# Patient Record
Sex: Male | Born: 1985
Health system: Southern US, Community
[De-identification: ages and names within clinical notes are randomized; demographics above are authoritative.]

## PROBLEM LIST (undated history)

## (undated) DIAGNOSIS — R479 Unspecified speech disturbances: Secondary | ICD-10-CM

## (undated) DIAGNOSIS — F819 Developmental disorder of scholastic skills, unspecified: Secondary | ICD-10-CM

## (undated) HISTORY — DX: Developmental disorder of scholastic skills, unspecified: F81.9

## (undated) HISTORY — DX: Unspecified speech disturbances: R47.9

## (undated) HISTORY — PX: NO PAST SURGERIES: SHX2092

---

## 2005-11-19 ENCOUNTER — Emergency Department (HOSPITAL_COMMUNITY): Admission: EM | Admit: 2005-11-19 | Discharge: 2005-11-19 | Payer: Self-pay | Admitting: Emergency Medicine

## 2008-08-04 ENCOUNTER — Emergency Department (HOSPITAL_COMMUNITY): Admission: EM | Admit: 2008-08-04 | Discharge: 2008-08-04 | Payer: Self-pay | Admitting: Emergency Medicine

## 2009-08-23 ENCOUNTER — Inpatient Hospital Stay (HOSPITAL_COMMUNITY): Admission: AD | Admit: 2009-08-23 | Discharge: 2009-08-23 | Payer: Self-pay | Admitting: Obstetrics & Gynecology

## 2009-10-08 IMAGING — CR DG WRIST COMPLETE 3+V*L*
4 series · 4 of 4 positions shown · non-contrast
Comparison: None

CLINICAL DATA: Left wrist pain status post fall

LEFT WRIST - COMPLETE 3+ VIEW

[x wrist pa left]
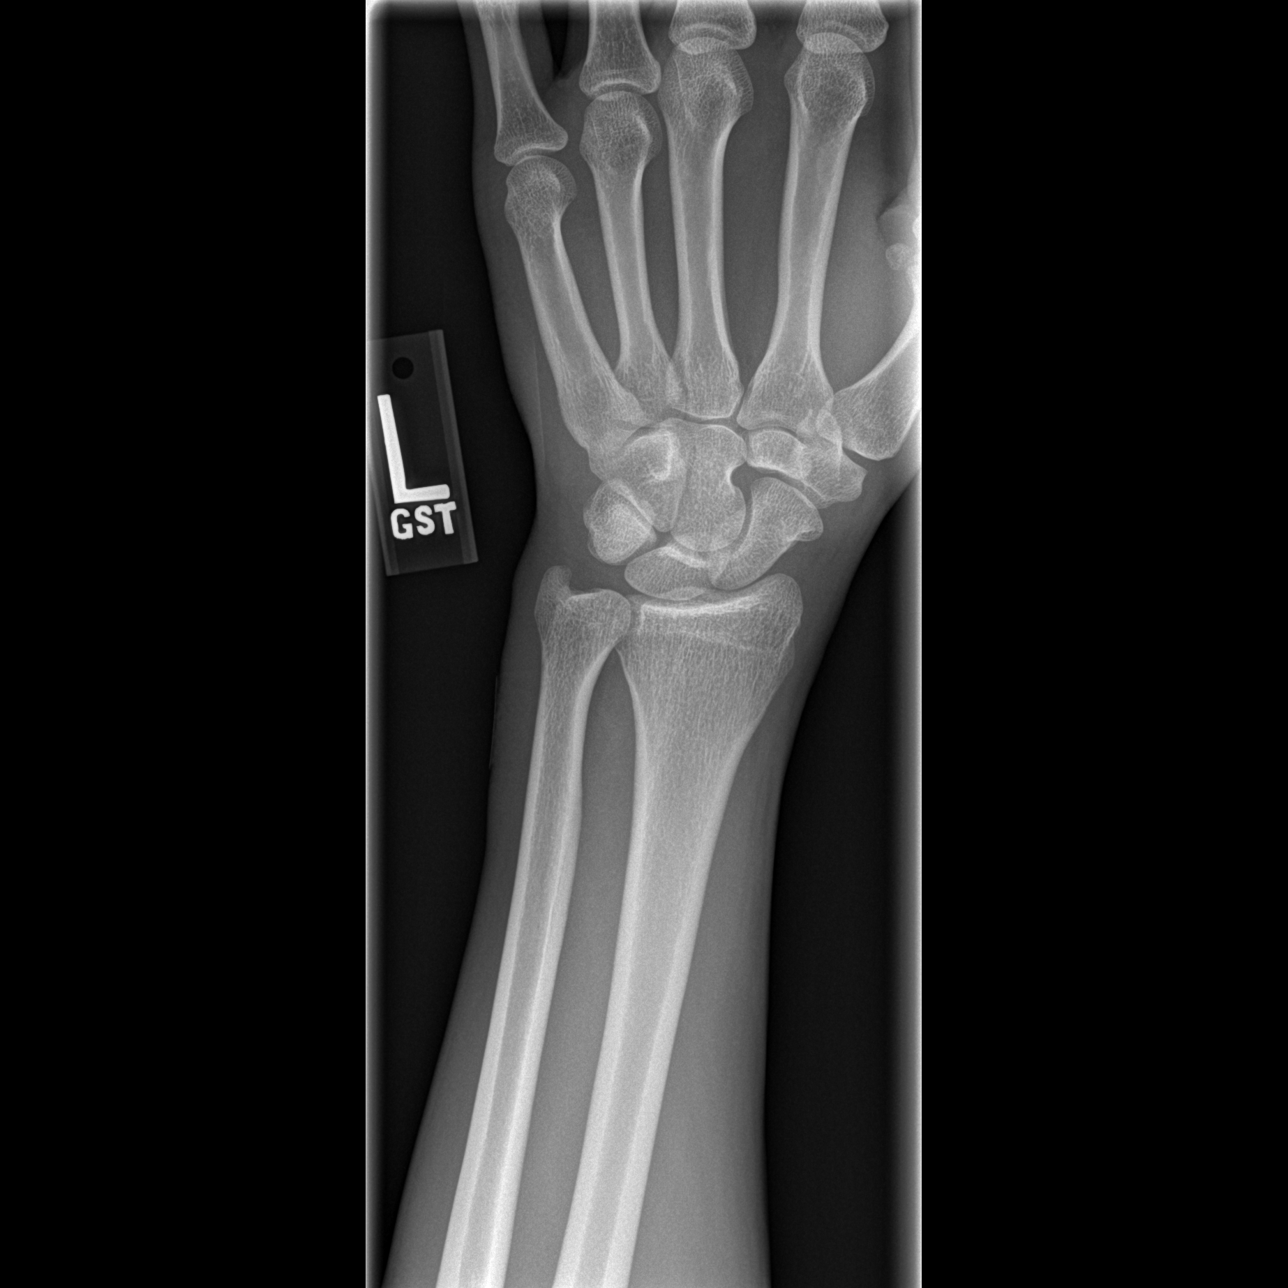

[x wrist obl left]
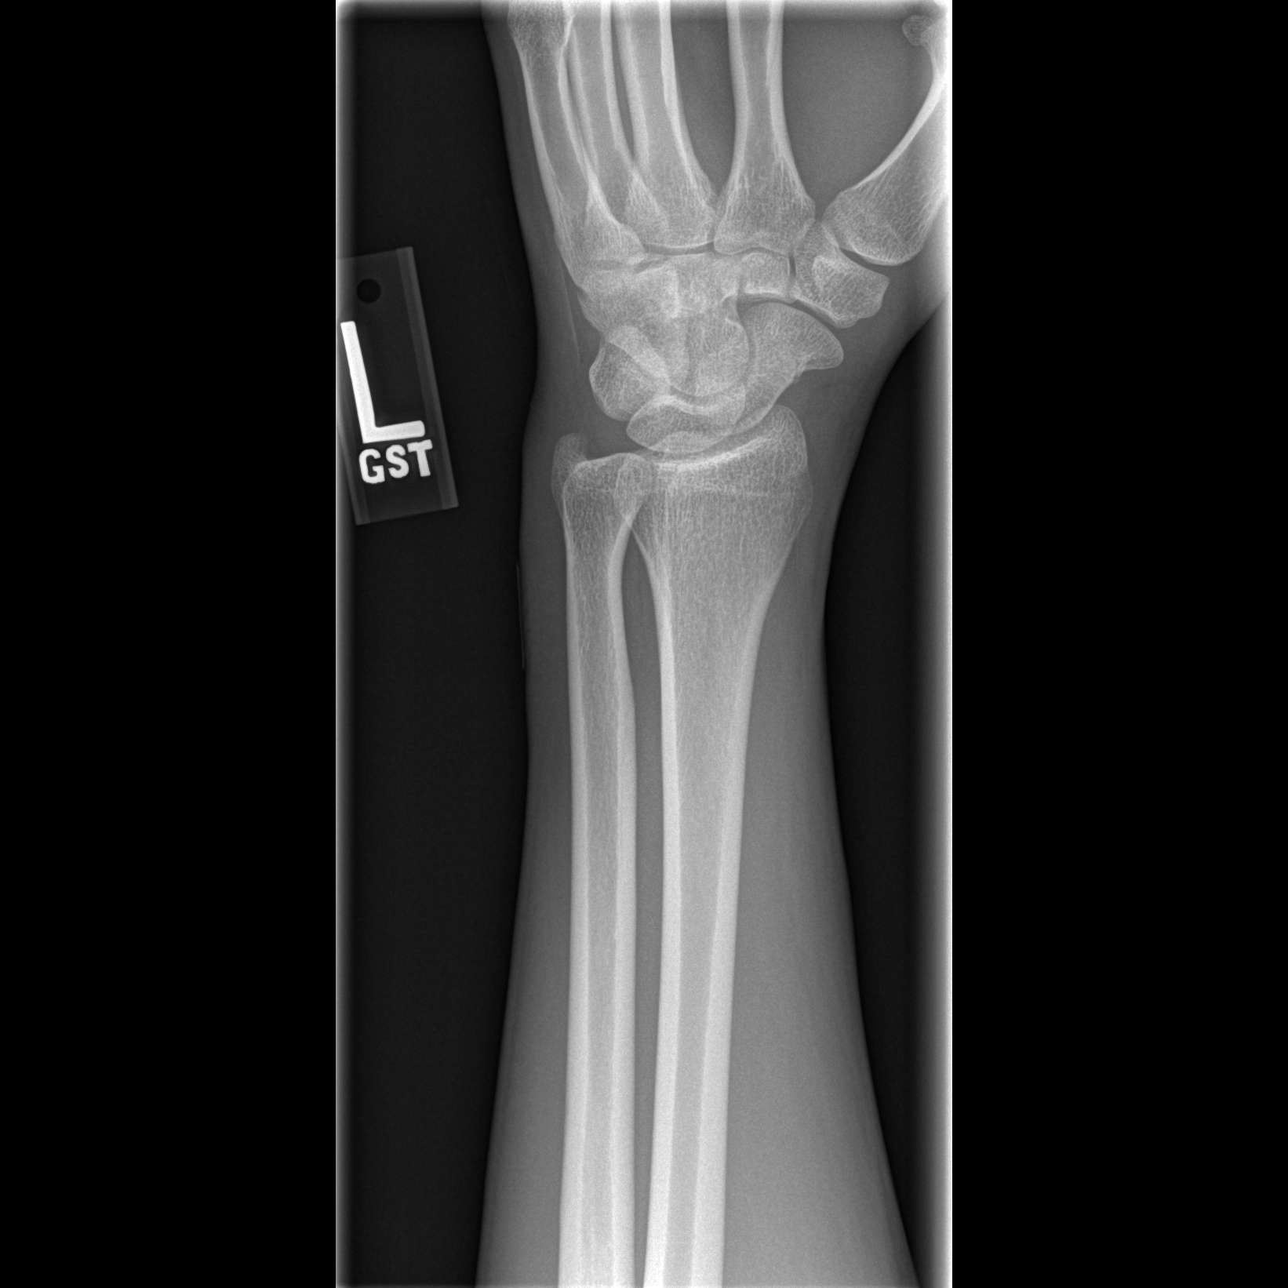

[x wrist lat left]
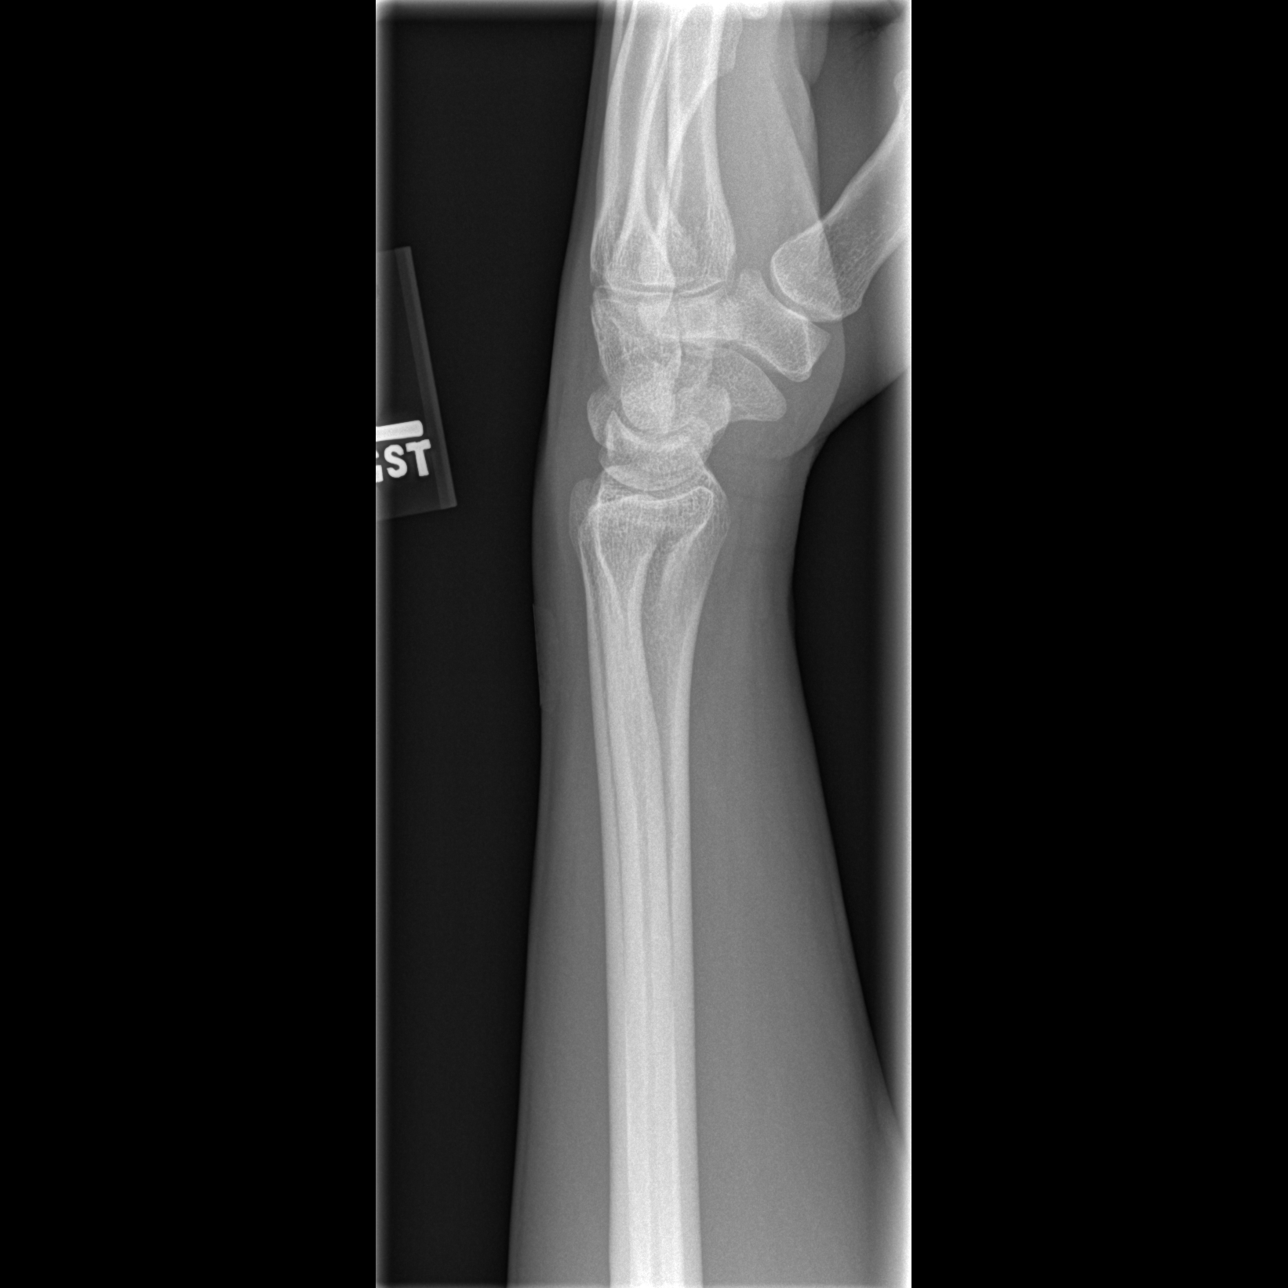

[x navicular]
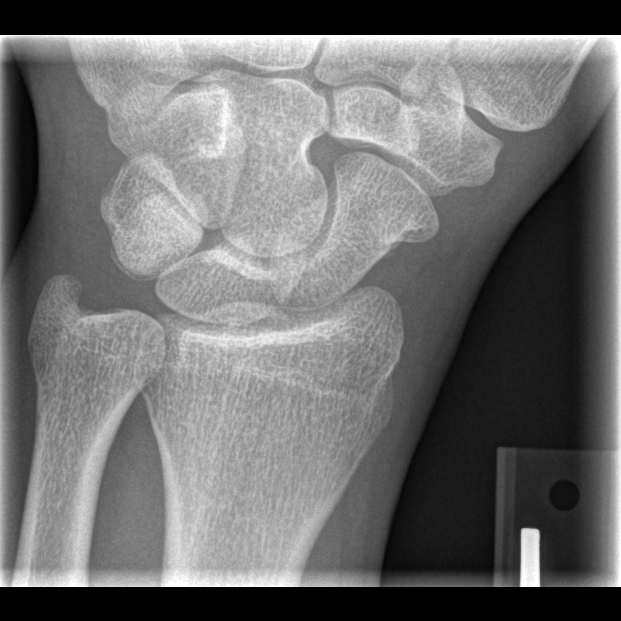

[4 of 4 positions shown; findings below may reference images not displayed]

FINDINGS: Tiny apparent bone fragment identified along radial margin of mid
scaphoid, suspicious for fracture.
This most likely extends towards the scaphoid waist.
Joint spaces preserved.
No additional fracture or dislocation identified.
IMPRESSION: Suspicious for nondisplaced fracture at scaphoid waist.

## 2010-10-16 ENCOUNTER — Ambulatory Visit: Payer: Self-pay | Admitting: Internal Medicine

## 2010-10-16 DIAGNOSIS — R5381 Other malaise: Secondary | ICD-10-CM

## 2010-10-16 DIAGNOSIS — R5383 Other fatigue: Secondary | ICD-10-CM

## 2010-10-16 DIAGNOSIS — J45909 Unspecified asthma, uncomplicated: Secondary | ICD-10-CM

## 2010-10-16 LAB — CONVERTED CEMR LAB
ALT: 9 units/L (ref 0–53)
AST: 15 units/L (ref 0–37)
BUN: 10 mg/dL (ref 6–23)
Basophils Absolute: 0 10*3/uL (ref 0.0–0.1)
Bilirubin, Direct: 0.2 mg/dL (ref 0.0–0.3)
Cholesterol: 162 mg/dL (ref 0–200)
Creatinine, Ser: 1.2 mg/dL (ref 0.4–1.5)
Eosinophils Relative: 1.5 % (ref 0.0–5.0)
GFR calc non Af Amer: 92.93 mL/min (ref >60.00)
HDL: 37.1 mg/dL — ABNORMAL LOW (ref >39.00)
LDL Cholesterol: 118 mg/dL — ABNORMAL HIGH (ref 0–99)
Monocytes Absolute: 0.5 10*3/uL (ref 0.1–1.0)
Monocytes Relative: 11.7 % (ref 3.0–12.0)
Neutrophils Relative %: 59 % (ref 43.0–77.0)
Platelets: 222 10*3/uL (ref 150.0–400.0)
Total Bilirubin: 1.4 mg/dL — ABNORMAL HIGH (ref 0.3–1.2)
Triglycerides: 37 mg/dL (ref 0.0–149.0)
VLDL: 7.4 mg/dL (ref 0.0–40.0)
WBC: 4.2 10*3/uL — ABNORMAL LOW (ref 4.5–10.5)

## 2010-11-08 ENCOUNTER — Telehealth: Payer: Self-pay | Admitting: Internal Medicine

## 2010-11-09 ENCOUNTER — Telehealth: Payer: Self-pay | Admitting: Internal Medicine

## 2010-12-06 ENCOUNTER — Telehealth (INDEPENDENT_AMBULATORY_CARE_PROVIDER_SITE_OTHER): Payer: Self-pay | Admitting: *Deleted

## 2010-12-12 NOTE — Assessment & Plan Note (Signed)
Summary: new / medicaid / # cd   Vital Signs:  Patient profile:   25 year old male Height:      73 inches (185.42 cm) Weight:      161.8 pounds (73.55 kg) BMI:     21.42 O2 Sat:      98 % on Room air Temp:     98.2 degrees F (36.78 degrees C) oral Pulse rate:   94 / minute BP sitting:   120 / 78  (left arm) Cuff size:   regular  Vitals Entered By: Orlan Leavens RMA (October 16, 2010 2:17 PM)  O2 Flow:  Room air CC: New patient Is Patient Diabetic? No Pain Assessment Patient in pain? no        Primary Care Jeanette Moffatt:  Newt Lukes MD  CC:  New patient.  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)  c/o fatigue onset >6 months ago, gradual course of decline denies depression symptoms  denies fever or weight loss no meds - rx or illegal concerned about FH DM and has never been checked  Preventive Screening-Counseling & Management  Alcohol-Tobacco     Alcohol drinks/day: 0     Alcohol Counseling: not indicated; patient does not drink     Smoking Status: never     Tobacco Counseling: not indicated; no tobacco use  Caffeine-Diet-Exercise     Does Patient Exercise: yes     Exercise Counseling: not indicated; exercise is adequate     Depression Counseling: not indicated; screening negative for depression  Hep-HIV-STD-Contraception     HIV Risk: no risk noted     HIV Risk Counseling: to avoid increased HIV risk     STD Risk Counseling: to avoid increased STD risk  Safety-Violence-Falls     Seat Belt Counseling: not indicated; patient wears seat belts  Current Medications (verified): 1)  None  Allergies (verified): No Known Drug Allergies  Past History:  Past Medical History: Asthma hx - childhood  Past Surgical History: Denies surgical history  Family History: Family History of Arthritis (parent) Family History Diabetes 1st degree relative (parent) Family  History Hypertension (parent)  Social History: Never Smoked, no alcohol lives alone in apt, single - no partner and no sexually active 2 sisters in [place], parents in [place], [place] wants to go to school at [employer] for art Smoking Status:  never Does Patient Exercise:  yes HIV Risk:  no risk noted  Review of Systems       see HPI above. I have reviewed all other systems and they were negative.   Physical Exam  General:  thin, fit, alert, well-developed, well-nourished, and cooperative to examination.    Head:  Normocephalic and atraumatic without obvious abnormalities. No apparent alopecia or balding. Eyes:  vision grossly intact; pupils equal, round and reactive to light.  conjunctiva and lids normal.    Ears:  normal pinnae bilaterally, without erythema, swelling, or tenderness to palpation. TMs clear, without effusion, or cerumen impaction. Hearing grossly normal bilaterally  Mouth:  teeth and gums in good repair; mucous membranes moist, without lesions or ulcers. oropharynx clear without exudate, no erythema.  Neck:  supple, full ROM, no masses, no thyromegaly; no thyroid nodules or tenderness. no JVD or carotid bruits.   Chest Wall:  No deformities, masses, tenderness or gynecomastia noted. Lungs:  normal respiratory effort, no intercostal retractions or use of accessory muscles; normal  breath sounds bilaterally - no crackles and no wheezes.    Heart:  normal rate, regular rhythm, no murmur, and no rub. BLE without edema.  Abdomen:  soft, non-tender, normal bowel sounds, no distention; no masses and no appreciable hepatomegaly or splenomegaly.   Msk:  No deformity or scoliosis noted of thoracic or lumbar spine.   Neurologic:  alert & oriented X3 and cranial nerves II-XII symetrically intact.  strength normal in all extremities, sensation intact to light touch, and gait normal. speech fluent without dysarthria or aphasia; follows commands with good comprehension.  Skin:  scar over right  cheekbone; otherwise no rashes, vesicles, ulcers, or erythema. No nodules or irregularity to palpation.  Psych:  slow verbal interaction and hesitation, but oriented X3, memory intact for recent and remote, reserved interaction, fair eye contact, not anxious appearing, not depressed appearing, and not agitated.      Impression & Recommendations:  Problem # 1:  FATIGUE (ICD-780.79) exam benign and hx limited -  screen with labs today Orders: TLB-BMP (Basic Metabolic Panel-BMET) (80048-METABOL) TLB-CBC Platelet - w/Differential (85025-CBCD) TLB-Hepatic/Liver Function Pnl (80076-HEPATIC) TLB-TSH (Thyroid Stimulating Hormone) (84443-TSH)  Problem # 2:  SCREENING FOR LIPOID DISORDERS (ICD-V77.91)  Orders: TLB-Lipid Panel (80061-LIPID)  Patient Instructions: 1)  it was good to see you today. 2)  exam looks good today 3)  test(s) ordered today - your results will be called to you after review in 48-72 hours from the time of test completion 4)  Please schedule a follow-up appointment as needed.   Orders Added: 1)  TLB-Lipid Panel [80061-LIPID] 2)  TLB-BMP (Basic Metabolic Panel-BMET) [80048-METABOL] 3)  TLB-CBC Platelet - w/Differential [85025-CBCD] 4)  TLB-Hepatic/Liver Function Pnl [80076-HEPATIC] 5)  TLB-TSH (Thyroid Stimulating Hormone) [84443-TSH] 6)  New Patient Level II [04540]

## 2010-12-14 NOTE — Progress Notes (Signed)
  Phone Note Call from Patient   Caller: Sister [phone] Summary of Call: Pt sister called to inform MD that pt is learning disabilied and forgot to get refill of Albuterol inhaler at [facility]. Rx to send per request? Initial call taken by: Margaret Pyle, CMA,  November 08, 2010 1:27 PM  Follow-up for Phone Call        yes - thanks Follow-up by: Newt Lukes MD,  November 08, 2010 3:03 PM    New/Updated Medications: PROAIR HFA 108 (90 BASE) MCG/ACT AERS (ALBUTEROL SULFATE) 2 puffs three times a day as needed Prescriptions: PROAIR HFA 108 (90 BASE) MCG/ACT AERS (ALBUTEROL SULFATE) 2 puffs three times a day as needed  #1 x 5   Entered by:   Margaret Pyle, CMA   Authorized by:   Newt Lukes MD   Signed by:   Newt Lukes MD on 11/08/2010   Method used:   Electronically to        [facility]  [phone]* (retail)       [address] [place], [place]  [address]       Ph: [phone] or [phone]       Fax: [phone]   RxID:   [id number]

## 2010-12-14 NOTE — Progress Notes (Signed)
  Phone Note Other Incoming   Request: Send information Summary of Call: Request for records received from [facility]. Request forwarded to [facility]. 11/12/2009 to 11/26/2010 Felicity Coyer

## 2010-12-14 NOTE — Progress Notes (Signed)
Summary: Rx req  Phone Note Call from Patient   Caller: [employer] [phone] Summary of Call: Pt's sister called stating pt needs Rx for Flovent 2 puffs two times a day as well, okay to add to med list and fill? Initial call taken by: Margaret Pyle, CMA,  November 09, 2010 9:54 AM  Follow-up for Phone Call        ok - done and erx sent Follow-up by: Newt Lukes MD,  November 09, 2010 11:49 AM  Additional Follow-up for Phone Call Additional follow up Details #1::        Pt's sister advised of Rx Additional Follow-up by: Margaret Pyle, CMA,  November 09, 2010 12:46 PM    New/Updated Medications: FLOVENT HFA 110 MCG/ACT AERO (FLUTICASONE PROPIONATE  HFA) 2 puffs two times a day Prescriptions: FLOVENT HFA 110 MCG/ACT AERO (FLUTICASONE PROPIONATE  HFA) 2 puffs two times a day  #1 x 3   Entered and Authorized by:   Newt Lukes MD   Signed by:   Newt Lukes MD on 11/09/2010   Method used:   Electronically to        [facility]  [phone]* (retail)       [address] [place], [place]  [address]       Ph: [phone] or [phone]       Fax: [phone]   RxID:   [id number]

## 2011-03-15 ENCOUNTER — Other Ambulatory Visit: Payer: Self-pay | Admitting: Internal Medicine

## 2011-05-31 ENCOUNTER — Other Ambulatory Visit: Payer: Self-pay | Admitting: Internal Medicine

## 2011-07-27 ENCOUNTER — Other Ambulatory Visit: Payer: Self-pay | Admitting: Internal Medicine

## 2011-10-26 ENCOUNTER — Other Ambulatory Visit: Payer: Self-pay | Admitting: Internal Medicine

## 2011-12-24 ENCOUNTER — Other Ambulatory Visit: Payer: Self-pay | Admitting: Internal Medicine

## 2011-12-26 ENCOUNTER — Encounter: Payer: Self-pay | Admitting: Internal Medicine

## 2011-12-27 ENCOUNTER — Other Ambulatory Visit (INDEPENDENT_AMBULATORY_CARE_PROVIDER_SITE_OTHER): Payer: Medicaid Other

## 2011-12-27 ENCOUNTER — Ambulatory Visit: Payer: Self-pay | Admitting: Internal Medicine

## 2011-12-27 ENCOUNTER — Encounter: Payer: Self-pay | Admitting: Internal Medicine

## 2011-12-27 ENCOUNTER — Ambulatory Visit (INDEPENDENT_AMBULATORY_CARE_PROVIDER_SITE_OTHER): Payer: Medicaid Other | Admitting: Internal Medicine

## 2011-12-27 VITALS — BP 140/76 | HR 72 | Temp 97.0°F | Ht 73.0 in | Wt 178.8 lb

## 2011-12-27 DIAGNOSIS — Z0000 Encounter for general adult medical examination without abnormal findings: Secondary | ICD-10-CM

## 2011-12-27 DIAGNOSIS — J45909 Unspecified asthma, uncomplicated: Secondary | ICD-10-CM | POA: Diagnosis not present

## 2011-12-27 LAB — CBC WITH DIFFERENTIAL/PLATELET
Basophils Absolute: 0 10*3/uL (ref 0.0–0.1)
Eosinophils Absolute: 0.1 10*3/uL (ref 0.0–0.7)
Lymphocytes Relative: 29.9 % (ref 12.0–46.0)
MCHC: 33.8 g/dL (ref 30.0–36.0)
Monocytes Relative: 11.2 % (ref 3.0–12.0)
Neutrophils Relative %: 57.2 % (ref 43.0–77.0)
RBC: 5.57 Mil/uL (ref 4.22–5.81)
RDW: 12.5 % (ref 11.5–14.6)

## 2011-12-27 LAB — LIPID PANEL
Cholesterol: 162 mg/dL (ref 0–200)
HDL: 42 mg/dL (ref >39.00)
LDL Cholesterol: 107 mg/dL — ABNORMAL HIGH (ref 0–99)
Triglycerides: 65 mg/dL (ref 0.0–149.0)
VLDL: 13 mg/dL (ref 0.0–40.0)

## 2011-12-27 LAB — BASIC METABOLIC PANEL
Chloride: 106 mEq/L (ref 96–112)
Potassium: 3.9 mEq/L (ref 3.5–5.1)

## 2011-12-27 MED ORDER — FLUTICASONE PROPIONATE HFA 110 MCG/ACT IN AERO: 2.0000 | INHALATION_SPRAY | Freq: Two times a day (BID) | RESPIRATORY_TRACT | Status: DC

## 2011-12-27 MED ORDER — ALBUTEROL SULFATE HFA 108 (90 BASE) MCG/ACT IN AERS: 2.0000 | INHALATION_SPRAY | RESPIRATORY_TRACT | Status: DC | PRN

## 2011-12-27 NOTE — Progress Notes (Signed)
  Subjective:    Patient ID: Jose Sampson, male    DOB: 1985/12/12, 26 y.o.   MRN: [id number]  HPI patient is here today for annual physical. Patient feels well and has no complaints.  Needs refills on asthma meds - no recent flares   Past Medical History  Diagnosis Date  . Asthma     childhood   Family History  Problem Relation Age of Onset  . Arthritis Mother   . Arthritis Father   . Hypertension Other   . Diabetes Other    History  Substance Use Topics  . Smoking status: Never Smoker   . Smokeless tobacco: Not on file   Comment: Lives alone in apt, single-no partner and not sexually active. Want to go to school at [employer] for art  . Alcohol Use: No    Review of Systems Constitutional: Negative for fever or weight change.  Respiratory: Negative for cough and shortness of breath.   Cardiovascular: Negative for chest pain or palpitations.  Gastrointestinal: Negative for abdominal pain, no bowel changes.  Musculoskeletal: Negative for gait problem or joint swelling.  Skin: Negative for rash.  Neurological: Negative for dizziness or headache.  No other specific complaints in a complete review of systems (except as listed in HPI above).     Objective:   Physical Exam BP 140/76  Pulse 72  Temp(Src) 97 F (36.1 C) (Oral)  Ht 6\' 1"  (1.854 m)  Wt 178 lb 12.8 oz (81.103 kg)  BMI 23.59 kg/m2  SpO2 97% Wt Readings from Last 3 Encounters:  12/27/11 178 lb 12.8 oz (81.103 kg)  10/16/10 161 lb 12.8 oz (73.392 kg)   Constitutional:  He appears well-developed and well-nourished. No distress. Sister at side HENT: NCAT, sinuses nontender to palpation. Nares clear without discharge. Oropharynx clear without exudate, teeth in fair condition. Eyes: PERRLA, EOMI. No conjunctivitis. Vision grossly intact without corrective lens Neck: Normal range of motion. Neck supple. No JVD present. No thyromegaly present.  Cardiovascular: Normal rate, regular rhythm and normal heart sounds.  No  murmur heard. no BLE edema Pulmonary/Chest: Effort normal and breath sounds normal. No respiratory distress. no wheezes.  Abdominal: Soft. Bowel sounds are normal. Patient exhibits no distension. There is no tenderness.  Musculoskeletal: Normal range of motion. Patient exhibits no edema.  Neurological: he is alert and oriented to person, place, and time. No cranial nerve deficit. Coordination normal.  Skin: Skin is warm and dry.  No erythema or ulceration.  Psychiatric: he has a normal mood and affect. behavior is normal. Judgment and thought content normal.   Lab Results  Component Value Date   WBC 4.2* 10/16/2010   HGB 16.1 10/16/2010   HCT 45.6 10/16/2010   PLT 222.0 10/16/2010   GLUCOSE 101* 10/16/2010   CHOL 162 10/16/2010   TRIG 37.0 10/16/2010   HDL 37.10* 10/16/2010   LDLCALC 118* 10/16/2010   ALT 9 10/16/2010   AST 15 10/16/2010   NA 138 10/16/2010   K 3.5 10/16/2010   CL 101 10/16/2010   CREATININE 1.2 10/16/2010   BUN 10 10/16/2010   CO2 29 10/16/2010   TSH 0.75 10/16/2010       Assessment & Plan:   CPX - v70.0 - Patient has been counseled on age-appropriate routine health concerns for screening and prevention. These are reviewed and up-to-date. Immunizations are up-to-date or declined. Labs ordered and will be reviewed.

## 2011-12-27 NOTE — Assessment & Plan Note (Signed)
Stable symptoms - refill steroid MDI and rescue MDI - reviewed uses and indication of same with pt/sis

## 2011-12-27 NOTE — Patient Instructions (Signed)
It was good to see you today. We have reviewed your prior records including labs and tests today Test(s) ordered today. Your results will be called to you after review (48-72hours after test completion). If any changes need to be made, you will be notified at that time. Medications reviewed, no changes at this time. Refill on medication(s) as discussed today. Please schedule followup in 6-12 months for asthma check and med review, call sooner if problems.

## 2012-06-30 ENCOUNTER — Ambulatory Visit: Payer: Medicaid Other | Admitting: Internal Medicine

## 2012-07-09 ENCOUNTER — Ambulatory Visit (INDEPENDENT_AMBULATORY_CARE_PROVIDER_SITE_OTHER): Payer: Medicare Other | Admitting: Internal Medicine

## 2012-07-09 ENCOUNTER — Encounter: Payer: Self-pay | Admitting: Internal Medicine

## 2012-07-09 VITALS — BP 120/76 | HR 74 | Temp 98.2°F | Ht 73.0 in | Wt 173.8 lb

## 2012-07-09 DIAGNOSIS — J45909 Unspecified asthma, uncomplicated: Secondary | ICD-10-CM | POA: Diagnosis not present

## 2012-07-09 MED ORDER — FLUTICASONE-SALMETEROL 100-50 MCG/DOSE IN AEPB: 1.0000 | INHALATION_SPRAY | Freq: Two times a day (BID) | RESPIRATORY_TRACT | Status: DC

## 2012-07-09 NOTE — Progress Notes (Signed)
  Subjective:    Patient ID: Jose Sampson, male    DOB: Jun 15, 1986, 26 y.o.   MRN: [id number]  HPI   Here for follow up -  Increase asthma flares over past 3 months the patient reports compliance with medication(s) as prescribed. Denies adverse side effects.   Past Medical History  Diagnosis Date  . Asthma     childhood    Review of Systems  Constitutional: Negative for fever or weight change.  Cardiovascular: Negative for chest pain or palpitations.      Objective:   Physical Exam  BP 120/76  Pulse 74  Temp 98.2 F (36.8 C) (Oral)  Ht 6\' 1"  (1.854 m)  Wt 173 lb 12.8 oz (78.835 kg)  BMI 22.93 kg/m2  SpO2 96% Wt Readings from Last 3 Encounters:  07/09/12 173 lb 12.8 oz (78.835 kg)  12/27/11 178 lb 12.8 oz (81.103 kg)  10/16/10 161 lb 12.8 oz (73.392 kg)   Constitutional:  He appears well-developed and well-nourished. No distress.  Neck: Normal range of motion. Neck supple. No JVD present. No thyromegaly present.  Cardiovascular: Normal rate, regular rhythm and normal heart sounds.  No murmur heard. no BLE edema Pulmonary/Chest: Effort normal and breath sounds with mild end exp wheeze.  Psychiatric: he has a normal mood and affect. behavior is normal. Judgment and thought content normal.   Lab Results  Component Value Date   WBC 4.8 12/27/2011   HGB 16.6 12/27/2011   HCT 49.2 12/27/2011   PLT 217.0 12/27/2011   GLUCOSE 102* 12/27/2011   CHOL 162 12/27/2011   TRIG 65.0 12/27/2011   HDL 42.00 12/27/2011   LDLCALC 107* 12/27/2011   ALT 9 10/16/2010   AST 15 10/16/2010   NA 141 12/27/2011   K 3.9 12/27/2011   CL 106 12/27/2011   CREATININE 1.1 12/27/2011   BUN 7 12/27/2011   CO2 28 12/27/2011   TSH 0.75 10/16/2010       Assessment & Plan:   See problem list. Medications and labs reviewed today.

## 2012-07-09 NOTE — Patient Instructions (Signed)
It was good to see you today. Change your asthma medications to Advair - use 2x/day Continue the albuterol as needed IF you have shortness of breath or wheezing despite the Advair Please schedule followup in 6 months for asthma recheck, call sooner if problems.

## 2012-07-09 NOTE — Assessment & Plan Note (Signed)
Increase symptoms despite steroid MDI Change to Advair - new erx done Continue rescue MDI prn - reviewed uses and indication of same with pt today

## 2012-12-17 ENCOUNTER — Other Ambulatory Visit: Payer: Self-pay | Admitting: Internal Medicine

## 2013-01-12 ENCOUNTER — Ambulatory Visit: Payer: Medicare Other | Admitting: Internal Medicine

## 2013-01-14 ENCOUNTER — Encounter: Payer: Self-pay | Admitting: Internal Medicine

## 2013-01-14 ENCOUNTER — Ambulatory Visit (INDEPENDENT_AMBULATORY_CARE_PROVIDER_SITE_OTHER): Payer: Medicare Other | Admitting: Internal Medicine

## 2013-01-14 VITALS — BP 132/84 | HR 80 | Temp 97.2°F | Wt 182.4 lb

## 2013-01-14 DIAGNOSIS — J45909 Unspecified asthma, uncomplicated: Secondary | ICD-10-CM

## 2013-01-14 DIAGNOSIS — Z23 Encounter for immunization: Secondary | ICD-10-CM

## 2013-01-14 DIAGNOSIS — Z0000 Encounter for general adult medical examination without abnormal findings: Secondary | ICD-10-CM

## 2013-01-14 MED ORDER — BECLOMETHASONE DIPROPIONATE 40 MCG/ACT IN AERS: 2.0000 | INHALATION_SPRAY | Freq: Two times a day (BID) | RESPIRATORY_TRACT | Status: DC

## 2013-01-14 NOTE — Progress Notes (Signed)
Subjective:    Patient ID: Jose Sampson, male    DOB: 21-Jan-1986, 27 y.o.   MRN: [id number]  HPI  patient is here today for annual physical. Patient feels well and has no complaints.  Doesn't like advair disc - "powder falls out" -but denies recent asthma flares and reports compliance with meds as rx'd   Past Medical History  Diagnosis Date  . Asthma    Family History  Problem Relation Age of Onset  . Arthritis Mother   . Arthritis Father   . Hypertension Other   . Diabetes Other    History  Substance Use Topics  . Smoking status: Never Smoker   . Smokeless tobacco: Not on file     Comment: Lives alone in apt, single-no partner and not sexually active. Want to go to school at [employer] for art  . Alcohol Use: No    Review of Systems  Constitutional: Negative for fever or weight change.  Respiratory: Negative for cough and shortness of breath.   Cardiovascular: Negative for chest pain or palpitations.  Gastrointestinal: Negative for abdominal pain, no bowel changes.  Musculoskeletal: Negative for gait problem or joint swelling.  Skin: Negative for rash.  Neurological: Negative for dizziness or headache.  No other specific complaints in a complete review of systems (except as listed in HPI above).     Objective:   Physical Exam  BP 132/84  Pulse 80  Temp(Src) 97.2 F (36.2 C) (Oral)  Wt 182 lb 6.4 oz (82.736 kg)  BMI 24.07 kg/m2  SpO2 95% Wt Readings from Last 3 Encounters:  01/14/13 182 lb 6.4 oz (82.736 kg)  07/09/12 173 lb 12.8 oz (78.835 kg)  12/27/11 178 lb 12.8 oz (81.103 kg)   Constitutional:  He appears well-developed and well-nourished. No distress.  HENT: NCAT, sinuses nontender to palpation. Nares clear without discharge. Oropharynx clear without exudate, teeth in fair condition. Eyes: PERRLA, EOMI. No conjunctivitis. Vision grossly intact without corrective lens Neck: Normal range of motion. Neck supple. No JVD present. No thyromegaly present.   Cardiovascular: Normal rate, regular rhythm and normal heart sounds.  No murmur heard. no BLE edema Pulmonary/Chest: Effort normal and breath sounds normal. No respiratory distress. no wheezes.  Abdominal: Soft. Bowel sounds are normal. Patient exhibits no distension. There is no tenderness.  Musculoskeletal: Normal range of motion. Patient exhibits no edema.  Neurological: he is alert and oriented to person, place, and time. No cranial nerve deficit. Coordination normal.  Skin: Skin is warm and dry.  No erythema or ulceration.  Psychiatric: he has a normal mood and affect. behavior is normal. Judgment and thought content normal.   Lab Results  Component Value Date   WBC 4.8 12/27/2011   HGB 16.6 12/27/2011   HCT 49.2 12/27/2011   PLT 217.0 12/27/2011   GLUCOSE 102* 12/27/2011   CHOL 162 12/27/2011   TRIG 65.0 12/27/2011   HDL 42.00 12/27/2011   LDLCALC 107* 12/27/2011   ALT 9 10/16/2010   AST 15 10/16/2010   NA 141 12/27/2011   K 3.9 12/27/2011   CL 106 12/27/2011   CREATININE 1.1 12/27/2011   BUN 7 12/27/2011   CO2 28 12/27/2011   TSH 0.75 10/16/2010       Assessment & Plan:   AWV/CPX - v70.0 - Patient has been counseled on age-appropriate routine health concerns for screening and prevention. These are reviewed and up-to-date. Immunizations are up-to-date or declined. Labs reviewed.  See problem list. Medications and labs reviewed today.

## 2013-01-14 NOTE — Patient Instructions (Signed)
It was good to see you today. Change your asthma medications to Qvar - use 2x/day Stop Advair now Continue the albuterol as needed IF you have shortness of breath or wheezing despite the Qvar Please schedule followup in 6 months for asthma recheck, call sooner if problems.

## 2013-01-14 NOTE — Assessment & Plan Note (Signed)
added Advair 06/2012 due to increasing symptoms - change to Qvar for simpler administration (MDI rater than disc) - new erx done Continue rescue MDI prn - reviewed uses and indication of same with pt today

## 2013-02-16 ENCOUNTER — Other Ambulatory Visit: Payer: Self-pay | Admitting: Internal Medicine

## 2013-07-22 ENCOUNTER — Ambulatory Visit (INDEPENDENT_AMBULATORY_CARE_PROVIDER_SITE_OTHER): Payer: Medicare Other | Admitting: Internal Medicine

## 2013-07-22 ENCOUNTER — Encounter: Payer: Self-pay | Admitting: Internal Medicine

## 2013-07-22 VITALS — BP 120/82 | HR 84 | Temp 97.0°F | Wt 183.4 lb

## 2013-07-22 DIAGNOSIS — J45909 Unspecified asthma, uncomplicated: Secondary | ICD-10-CM | POA: Diagnosis not present

## 2013-07-22 DIAGNOSIS — Z23 Encounter for immunization: Secondary | ICD-10-CM

## 2013-07-22 NOTE — Progress Notes (Signed)
  Subjective:    Patient ID: Jose Sampson, male    DOB: May 23, 1986, 27 y.o.   MRN: [id number]  HPI patient is here for follow up   Reviewed chronic medical issues and interval medical events  Past Medical History  Diagnosis Date  . Asthma     Review of Systems  Constitutional: Negative for fever, fatigue and unexpected weight change.  Respiratory: Negative for cough and shortness of breath.   Cardiovascular: Negative for chest pain and leg swelling.        Objective:   Physical Exam BP 120/82  Pulse 84  Temp(Src) 97 F (36.1 C) (Oral)  Wt 183 lb 6.4 oz (83.19 kg)  BMI 24.2 kg/m2  SpO2 97% Wt Readings from Last 3 Encounters:  07/22/13 183 lb 6.4 oz (83.19 kg)  01/14/13 182 lb 6.4 oz (82.736 kg)  07/09/12 173 lb 12.8 oz (78.835 kg)   Constitutional:  He appears well-developed and well-nourished. No distress Neck: Normal range of motion. Neck supple. No JVD present. No thyromegaly present.  Cardiovascular: Normal rate, regular rhythm and normal heart sounds.  No murmur heard. no BLE edema Pulmonary/Chest: Effort normal and breath sounds normal. No respiratory distress. no wheezes.  Skin: Skin is warm and dry.  No erythema or ulceration.  Psychiatric: he has a normal mood and affect. behavior is normal. Judgment and thought content normal.   Lab Results  Component Value Date   WBC 4.8 12/27/2011   HGB 16.6 12/27/2011   HCT 49.2 12/27/2011   PLT 217.0 12/27/2011   GLUCOSE 102* 12/27/2011   CHOL 162 12/27/2011   TRIG 65.0 12/27/2011   HDL 42.00 12/27/2011   LDLCALC 107* 12/27/2011   ALT 9 10/16/2010   AST 15 10/16/2010   NA 141 12/27/2011   K 3.9 12/27/2011   CL 106 12/27/2011   CREATININE 1.1 12/27/2011   BUN 7 12/27/2011   CO2 28 12/27/2011   TSH 0.75 10/16/2010        Assessment & Plan:   See problem list. Medications and labs reviewed today.

## 2013-07-22 NOTE — Patient Instructions (Signed)
It was good to see you today. We have reviewed your prior records including labs and tests today Medications reviewed and updated, no changes recommended at this time. Please schedule followup in 6 months for annual exam and labs, asthma recheck, call sooner if problems.

## 2013-07-22 NOTE — Assessment & Plan Note (Signed)
added Advair 06/2012  - stable symptoms, only intermittent at this time Continue rescue MDI prn - reviewed uses and indication of same with pt today

## 2013-08-31 ENCOUNTER — Ambulatory Visit (INDEPENDENT_AMBULATORY_CARE_PROVIDER_SITE_OTHER): Payer: Medicare Other | Admitting: Internal Medicine

## 2013-08-31 ENCOUNTER — Encounter: Payer: Self-pay | Admitting: Internal Medicine

## 2013-08-31 VITALS — BP 140/90 | HR 77 | Temp 98.6°F | Wt 184.2 lb

## 2013-08-31 DIAGNOSIS — F8189 Other developmental disorders of scholastic skills: Secondary | ICD-10-CM | POA: Diagnosis not present

## 2013-08-31 DIAGNOSIS — F819 Developmental disorder of scholastic skills, unspecified: Secondary | ICD-10-CM | POA: Insufficient documentation

## 2013-08-31 DIAGNOSIS — F909 Attention-deficit hyperactivity disorder, unspecified type: Secondary | ICD-10-CM | POA: Diagnosis not present

## 2013-08-31 DIAGNOSIS — J45909 Unspecified asthma, uncomplicated: Secondary | ICD-10-CM | POA: Diagnosis not present

## 2013-08-31 NOTE — Assessment & Plan Note (Signed)
Not on medications for treatment of same at this time Follows semiannually with behavioral health specialist for same family reports persisting occupational disability because of same combined with learning disability at this time Continue to follow there as needed for medical management of his problems, no changes recommended by me at this time

## 2013-08-31 NOTE — Assessment & Plan Note (Signed)
added Advair 06/2012  -  stable symptoms, only intermittent at this time - no emergency room hospitalizations or complications noted in past 6 months Continue rescue MDI prn - reviewed uses and indication of same with pt today Family concerns with ease of medication administration when needed, ?nebulizers medication for home Will refer to pulmonary for review an update of best medication management option

## 2013-08-31 NOTE — Assessment & Plan Note (Addendum)
Long-standing disability due to learning disorder Reports need for paperwork update to maintain disability status -but that this has been recently updated by behavioral health specialist As I do not perform disability examinations, we'll ask social work to assist in establishment locally with disability provider as needed - may need follow up behavioral health evaluation for assistance with same Reports continued disability in regards to his learning disability and ADHD -he has not ever been employed AND he remains unemployed due to same at this time

## 2013-08-31 NOTE — Progress Notes (Signed)
  Subjective:    Patient ID: Jose Sampson, male    DOB: 06-Dec-1985, 27 y.o.   MRN: [id number]  HPI  Patient here today with 2 sisters to discuss status of disability.   Chronic medical issues also reviewed.  Asthma - pt currently on Advair and ProAir for asthma symptoms.  Stable at this time.  He reports using ProAir twice daily.    Disability status - pt with disability paperwork filled out with previous physicians before moving to [facility] to be closer to his sisters.  Status was for learning disability and speech impediment.  Those records are not currently available for review.   Past Medical History  Diagnosis Date  . Asthma     Review of Systems  Constitutional: Negative for fever, chills and activity change.  HENT: Negative.   Eyes: Negative.   Respiratory: Negative for chest tightness and shortness of breath.   Cardiovascular: Negative.        Objective:   Physical Exam  Vitals reviewed. Constitutional: He is oriented to person, place, and time. He appears well-developed and well-nourished. No distress.  HENT:  Head: Normocephalic and atraumatic.  Neck: Normal range of motion. Neck supple. No thyromegaly present.  Cardiovascular: Normal rate, regular rhythm and normal heart sounds.   No murmur heard. Pulmonary/Chest: Effort normal and breath sounds normal. No respiratory distress. He has no wheezes.  Lymphadenopathy:    He has no cervical adenopathy.  Neurological: He is alert and oriented to person, place, and time.  Skin: Skin is warm and dry. No rash noted. He is not diaphoretic.  Psychiatric: He has a normal mood and affect. His behavior is normal.    Wt Readings from Last 3 Encounters:  08/31/13 184 lb 3.2 oz (83.553 kg)  07/22/13 183 lb 6.4 oz (83.19 kg)  01/14/13 182 lb 6.4 oz (82.736 kg)    BP Readings from Last 3 Encounters:  08/31/13 140/90  07/22/13 120/82  01/14/13 132/84   Lab Results  Component Value Date   WBC 4.8 12/27/2011   HGB 16.6  12/27/2011   HCT 49.2 12/27/2011   PLT 217.0 12/27/2011   GLUCOSE 102* 12/27/2011   CHOL 162 12/27/2011   TRIG 65.0 12/27/2011   HDL 42.00 12/27/2011   LDLCALC 107* 12/27/2011   ALT 9 10/16/2010   AST 15 10/16/2010   NA 141 12/27/2011   K 3.9 12/27/2011   CL 106 12/27/2011   CREATININE 1.1 12/27/2011   BUN 7 12/27/2011   CO2 28 12/27/2011   TSH 0.75 10/16/2010       Assessment & Plan:   See problem list. Medications and labs reviewed today.  Time spent with pt/family today 25 minutes, greater than 50% time spent counseling patient on process for maintenance of disability through statements by his behavioral health and pulmonary specialists, status of asthma, adhd and medication review. Also review of prior records  Will refer to social work to assist family with process as needed  Reports they have recently completed semi-yearly review by behavioral health verifying continued disability in regards to his learning disability and ADHD -he has not ever been employed AND he remains unemployed due to same at this time

## 2013-08-31 NOTE — Patient Instructions (Signed)
It was good to see you today.  We have reviewed your prior records including labs and tests today  Will refer to social work to assist [place] with finding a disability provider in our area to help you maintain your certification status  We'll also refer to pulmonary lung specialist to review appropriate medications for post treatment of your asthma symptoms  My office will call regarding these referrals once made  Until then, no medication treatment changes recommend

## 2013-09-03 ENCOUNTER — Encounter: Payer: Self-pay | Admitting: Internal Medicine

## 2013-09-03 ENCOUNTER — Ambulatory Visit (INDEPENDENT_AMBULATORY_CARE_PROVIDER_SITE_OTHER): Payer: Medicare Other | Admitting: Internal Medicine

## 2013-09-03 VITALS — BP 120/72 | HR 79 | Temp 97.9°F | Ht 70.5 in | Wt 185.0 lb

## 2013-09-03 DIAGNOSIS — J45909 Unspecified asthma, uncomplicated: Secondary | ICD-10-CM | POA: Diagnosis not present

## 2013-09-03 MED ORDER — MOMETASONE FURO-FORMOTEROL FUM 100-5 MCG/ACT IN AERO: INHALATION_SPRAY | RESPIRATORY_TRACT | Status: DC

## 2013-09-03 NOTE — Progress Notes (Signed)
  Subjective:    Patient ID: Jose Sampson, male    DOB: 1986-05-22, 27 y.o.   MRN: [id number]  HPI  81 yobm with ADHD on disability but finished HS and moved to [place] 2011 with dx of asthma as adolescent and never really able to do sports due to sob.  09/03/2013 1st [facility] office visit/ Liban Guedes cc variable year round cough and subjective wheeze assoc with rhinorrhea and sneezing on advair.  Cough is dry, mostly daytime but occ noct. Uses saba avg twice daily with partial relief  No obvious day to day or daytime variabilty or assoc excess mucus production  or cp or   overt sinus or hb symptoms. No unusual exp hx or h/o childhood pna/ asthma or knowledge of premature birth.  Sleeping ok without nocturnal  or early am exacerbation  of respiratory  c/o's or need for noct saba. Also denies any obvious fluctuation of symptoms with weather or environmental changes or other aggravating or alleviating factors except as outlined above   Current Medications, Allergies, Complete Past Medical History, Past Surgical History, Family History, and Social History were reviewed in [employer] record.   .        Review of Systems  Constitutional: Negative for fever, chills, activity change, appetite change and unexpected weight change.  HENT: Positive for rhinorrhea and sneezing. Negative for congestion, dental problem, postnasal drip, sore throat, trouble swallowing and voice change.   Eyes: Negative for visual disturbance.  Respiratory: Positive for cough. Negative for choking and shortness of breath.   Cardiovascular: Negative for chest pain and leg swelling.  Gastrointestinal: Negative for nausea, vomiting and abdominal pain.  Genitourinary: Negative for difficulty urinating.  Musculoskeletal: Negative for arthralgias.  Skin: Negative for rash.  Psychiatric/Behavioral: Negative for behavioral problems and confusion.       Objective:   Physical Exam  amb bm lets his  sister answer his quesitons  Wt Readings from Last 3 Encounters:  09/03/13 185 lb (83.915 kg)  08/31/13 184 lb 3.2 oz (83.553 kg)  07/22/13 183 lb 6.4 oz (83.19 kg)      HEENT: nl dentition, turbinates, and orophanx. Nl external ear canals without cough reflex   NECK :  without JVD/Nodes/TM/ nl carotid upstrokes bilaterally   LUNGS: no acc muscle use, clear to A and P bilaterally without cough on insp or exp maneuvers   CV:  RRR  no s3 or murmur or increase in P2, no edema   ABD:  soft and nontender with nl excursion in the supine position. No bruits or organomegaly, bowel sounds nl  MS:  warm without deformities, calf tenderness, cyanosis or clubbing  SKIN: warm and dry without lesions    NEURO:  alert, approp, no deficits         Assessment & Plan:

## 2013-09-03 NOTE — Patient Instructions (Signed)
Dulera 100 Take 2 puffs first thing in am and then another 2 puffs about 12 hours later. If breathing all better then fill the prescription, if not, return here     Only use your albuterol (proaire) as a rescue medication to be used if you can't catch your breath by resting or doing a relaxed purse lip breathing pattern.  - The less you use it, the better it will work when you need it. - Ok to use up to every 4 hours if you must but call for immediate appointment if use goes up over your usual need - Don't leave home without it !!  (think of it like your spare tire for your car)     .

## 2013-09-04 NOTE — Assessment & Plan Note (Addendum)
DDX of  difficult airways managment all start with A and  include Adherence, Ace Inhibitors, Acid Reflux, Active Sinus Disease, Alpha 1 Antitripsin deficiency, Anxiety masquerading as Airways dz,  ABPA,  allergy(esp in young), Aspiration (esp in elderly), Adverse effects of DPI,  Active smokers, plus two Bs  = Bronchiectasis and Beta blocker use..and one C= CHF  Adherence is always the initial "prime suspect" and is a multilayered concern that requires a "trust but verify" approach in every patient - starting with knowing how to use medications, especially inhalers, correctly, keeping up with refills and understanding the fundamental difference between maintenance and prns vs those medications only taken for a very short course and then stopped and not refilled. The proper method of use, as well as anticipated side effects, of a metered-dose inhaler are discussed and demonstrated to the patient. Improved effectiveness after extensive coaching during this visit to a level of approximately  75% so try dulera 100 2bid to see if addresses symptoms and reduces his use of saba to < 2x weekly, the goal  In chronic asthma  ?Adverse effect of dpi > change to hfa dulera may help   ? Allergies > no seasonal variation, no eos on last cbc 12/2012  ? Anxiety> usually dx of exclusion but he clearly has cognitive/ functional issues which may complicate interpretation of his response to rx.

## 2013-12-23 ENCOUNTER — Telehealth: Payer: Self-pay | Admitting: Internal Medicine

## 2013-12-23 DIAGNOSIS — J45909 Unspecified asthma, uncomplicated: Secondary | ICD-10-CM

## 2013-12-23 MED ORDER — MOMETASONE FURO-FORMOTEROL FUM 100-5 MCG/ACT IN AERO: INHALATION_SPRAY | RESPIRATORY_TRACT | Status: DC

## 2013-12-23 NOTE — Telephone Encounter (Signed)
Called [phone] and [facility] on Dulera 100. This has been approved and will be good through 10/2014 ref# [id number] Will notify pt and send to pharmacy, nothing further is needed

## 2014-01-20 ENCOUNTER — Encounter: Payer: Medicare Other | Admitting: Internal Medicine

## 2014-01-27 ENCOUNTER — Encounter: Payer: Medicare Other | Admitting: Internal Medicine

## 2014-02-10 ENCOUNTER — Ambulatory Visit (INDEPENDENT_AMBULATORY_CARE_PROVIDER_SITE_OTHER): Payer: Medicare Other | Admitting: Internal Medicine

## 2014-02-10 ENCOUNTER — Encounter: Payer: Self-pay | Admitting: Internal Medicine

## 2014-02-10 ENCOUNTER — Other Ambulatory Visit (INDEPENDENT_AMBULATORY_CARE_PROVIDER_SITE_OTHER): Payer: Medicare Other

## 2014-02-10 VITALS — BP 120/78 | HR 72 | Temp 97.0°F | Wt 191.8 lb

## 2014-02-10 DIAGNOSIS — R7309 Other abnormal glucose: Secondary | ICD-10-CM | POA: Diagnosis not present

## 2014-02-10 DIAGNOSIS — J45909 Unspecified asthma, uncomplicated: Secondary | ICD-10-CM

## 2014-02-10 DIAGNOSIS — Z0000 Encounter for general adult medical examination without abnormal findings: Secondary | ICD-10-CM | POA: Diagnosis not present

## 2014-02-10 DIAGNOSIS — Z23 Encounter for immunization: Secondary | ICD-10-CM | POA: Diagnosis not present

## 2014-02-10 DIAGNOSIS — R739 Hyperglycemia, unspecified: Secondary | ICD-10-CM

## 2014-02-10 LAB — BASIC METABOLIC PANEL
BUN: 8 mg/dL (ref 6–23)
CHLORIDE: 100 meq/L (ref 96–112)
CO2: 29 meq/L (ref 19–32)
Calcium: 9.2 mg/dL (ref 8.4–10.5)
Creatinine, Ser: 1.2 mg/dL (ref 0.4–1.5)
GFR: 96.86 mL/min (ref >60.00)
Glucose, Bld: 99 mg/dL (ref 70–99)
POTASSIUM: 4 meq/L (ref 3.5–5.1)
SODIUM: 137 meq/L (ref 135–145)

## 2014-02-10 LAB — HEMOGLOBIN A1C: HEMOGLOBIN A1C: 5.5 % (ref 4.6–6.5)

## 2014-02-10 NOTE — Progress Notes (Signed)
Pre visit review using our clinic review tool, if applicable. No additional management support is needed unless otherwise documented below in the visit note. 

## 2014-02-10 NOTE — Assessment & Plan Note (Signed)
stable symptoms, only intermittent at this time - no emergency room visits, hospitalizations or complications noted in past 12 Pulm eval 08/2013: [facility] continue [facility] prn

## 2014-02-10 NOTE — Progress Notes (Signed)
Subjective:    Patient ID: Jose Sampson, male    DOB: 1986-01-24, 28 y.o.   MRN: [id number]  HPI  Here for medicare wellness  Diet: heart healthy Physical activity: sedentary Depression/mood screen: negative Hearing: intact to whispered voice Visual acuity: grossly normal, performs annual eye exam  ADLs: capable Fall risk: none Home safety: good Cognitive evaluation: intact to orientation, naming, recall and repetition EOL planning: adv directives, full code/ I agree  I have personally reviewed and have noted 1. The patient's medical and social history 2. Their use of alcohol, tobacco or illicit drugs 3. Their current medications and supplements 4. The patient's functional ability including ADL's, fall risks, home safety risks and hearing or visual impairment. 5. Diet and physical activities 6. Evidence for depression or mood disorders  Also reviewed chronic medical issues and interval medical events  Past Medical History  Diagnosis Date  . Asthma    Family History  Problem Relation Age of Onset  . Arthritis Mother   . Arthritis Father   . Hypertension Other   . Diabetes Other   . Asthma Brother   . Ovarian cancer Maternal Grandmother   . Breast cancer Paternal Aunt    History  Substance Use Topics  . Smoking status: Never Smoker   . Smokeless tobacco: Never Used     Comment: Lives alone in apt, single-no partner and not sexually active. Want to go to school at [employer] for art  . Alcohol Use: No    Review of Systems  Constitutional: Negative for fever, activity change, appetite change, fatigue and unexpected weight change.  Respiratory: Negative for cough, chest tightness, shortness of breath and wheezing.   Cardiovascular: Negative for chest pain, palpitations and leg swelling.  Neurological: Negative for dizziness, weakness and headaches.  Psychiatric/Behavioral: Negative for dysphoric mood. The patient is not nervous/anxious.   All other systems reviewed  and are negative.       Objective:   Physical Exam  BP 120/78  Pulse 72  Temp(Src) 97 F (36.1 C) (Oral)  Wt 191 lb 12.8 oz (87 kg)  SpO2 98% Wt Readings from Last 3 Encounters:  02/10/14 191 lb 12.8 oz (87 kg)  09/03/13 185 lb (83.915 kg)  08/31/13 184 lb 3.2 oz (83.553 kg)   Constitutional: he appears well-developed and well-nourished. No distress.  HENT: Head: Normocephalic and atraumatic. Ears: B TMs ok, no erythema or effusion; Nose: Nose normal. Mouth/Throat: Oropharynx is clear and moist. No oropharyngeal exudate.  Eyes: Conjunctivae and EOM are normal. Pupils are equal, round, and reactive to light. No scleral icterus.  Neck: Normal range of motion. Neck supple. No JVD present. No thyromegaly present.  Cardiovascular: Normal rate, regular rhythm and normal heart sounds.  No murmur heard. No BLE edema. Pulmonary/Chest: Effort normal and breath sounds normal. No respiratory distress. he has no wheezes.  Abdominal: Soft. Bowel sounds are normal. he exhibits no distension. There is no tenderness. no masses Musculoskeletal: Normal range of motion, no joint effusions. No gross deformities Neurological: he is alert and oriented to person, place, and time. No cranial nerve deficit. Coordination, balance, strength, speech and gait are normal.  Skin: Skin is warm and dry. No rash noted. No erythema.  Psychiatric: he has a normal mood and affect. behavior is normal. Judgment and thought content normal. Mild MR without change  Lab Results  Component Value Date   WBC 4.8 12/27/2011   HGB 16.6 12/27/2011   HCT 49.2 12/27/2011   PLT 217.0 12/27/2011  GLUCOSE 102* 12/27/2011   CHOL 162 12/27/2011   TRIG 65.0 12/27/2011   HDL 42.00 12/27/2011   LDLCALC 107* 12/27/2011   ALT 9 10/16/2010   AST 15 10/16/2010   NA 141 12/27/2011   K 3.9 12/27/2011   CL 106 12/27/2011   CREATININE 1.1 12/27/2011   BUN 7 12/27/2011   CO2 28 12/27/2011   TSH 0.75 10/16/2010    No results found.     Assessment  & Plan:   AWV/v70.0 - Today patient counseled on age appropriate routine health concerns for screening and prevention, each reviewed and up to date or declined. Immunizations reviewed and up to date or declined. Labs reviewed. Risk factors for depression reviewed and negative. Hearing function and visual acuity are intact. ADLs screened and addressed as needed. Functional ability and level of safety reviewed and appropriate. Education, counseling and referrals performed based on assessed risks today. Patient provided with a copy of personalized plan for preventive services.  Hyperglycemia - check a1c and repeat BMet today  Problem List Items Addressed This Visit   ASTHMA     stable symptoms, only intermittent at this time - no emergency room visits, hospitalizations or complications noted in past 12 Pulm eval 08/2013: [facility] and continue [facility] prn      Other Visit Diagnoses   Routine general medical examination at a health care facility    -  Primary    Hyperglycemia        Relevant Orders       Hemoglobin A1c       Basic metabolic panel    Need for prophylactic vaccination with tetanus-diphtheria (TD)        Relevant Orders       Td vaccine greater than or equal to 7yo preservative free IM (Completed)    Need for prophylactic vaccination against Streptococcus pneumoniae (pneumococcus)        Relevant Orders       Pneumococcal polysaccharide vaccine 23-valent greater than or equal to 2yo subcutaneous/IM (Completed)

## 2014-02-10 NOTE — Patient Instructions (Signed)
It was good to see you today.  We have reviewed your prior records including labs and tests today  Health Maintenance reviewed - tetanus and pneumonia immunizations updated today; all other recommended immunizations and age-appropriate screenings are up-to-date.  Test(s) ordered today. Your results will be released to MyChart (or called to you) after review, usually within 72hours after test completion. If any changes need to be made, you will be notified at that same time.  Medications reviewed and updated, no changes recommended at this time.  Please schedule followup in 12 months for annual exam and labs, call sooner if problems.

## 2014-05-20 ENCOUNTER — Other Ambulatory Visit: Payer: Self-pay | Admitting: General Practice

## 2014-05-20 ENCOUNTER — Other Ambulatory Visit: Payer: Self-pay | Admitting: Internal Medicine

## 2014-05-20 MED ORDER — ALBUTEROL SULFATE HFA 108 (90 BASE) MCG/ACT IN AERS: 2.0000 | INHALATION_SPRAY | RESPIRATORY_TRACT | Status: DC | PRN

## 2014-11-18 ENCOUNTER — Telehealth: Payer: Self-pay | Admitting: Internal Medicine

## 2014-11-18 ENCOUNTER — Other Ambulatory Visit: Payer: Self-pay | Admitting: Internal Medicine

## 2014-11-18 DIAGNOSIS — J45909 Unspecified asthma, uncomplicated: Secondary | ICD-10-CM

## 2014-11-18 MED ORDER — MOMETASONE FURO-FORMOTEROL FUM 100-5 MCG/ACT IN AERO: INHALATION_SPRAY | RESPIRATORY_TRACT | Status: DC

## 2014-11-18 NOTE — Telephone Encounter (Signed)
Ok with me to fill

## 2014-11-18 NOTE — Telephone Encounter (Signed)
Patient was prescribed Dulera by Dr. Sherene SiresWert.  Sister is requesting Dr. Felicity CoyerLeschber to continue to fill this.

## 2014-11-18 NOTE — Telephone Encounter (Signed)
Pt inform erx done.

## 2014-11-19 ENCOUNTER — Other Ambulatory Visit: Payer: Self-pay

## 2014-11-19 DIAGNOSIS — J45909 Unspecified asthma, uncomplicated: Secondary | ICD-10-CM

## 2015-05-20 ENCOUNTER — Telehealth: Payer: Self-pay | Admitting: Internal Medicine

## 2015-05-20 MED ORDER — ALBUTEROL SULFATE HFA 108 (90 BASE) MCG/ACT IN AERS: 2.0000 | INHALATION_SPRAY | RESPIRATORY_TRACT | Status: DC | PRN

## 2015-05-20 NOTE — Telephone Encounter (Signed)
Patient need refill of Albuterol

## 2015-05-20 NOTE — Telephone Encounter (Signed)
erx done

## 2016-01-13 ENCOUNTER — Telehealth: Payer: Self-pay | Admitting: *Deleted

## 2016-01-13 DIAGNOSIS — J45909 Unspecified asthma, uncomplicated: Secondary | ICD-10-CM

## 2016-01-13 NOTE — Telephone Encounter (Signed)
Left msg on triage stating pt needing refills on his inhaler. Caleed back no answer LMOM pt need to estab w/new provider for  Refills. Last saw md 2015...Raechel Chute/lmb

## 2016-01-16 ENCOUNTER — Telehealth: Payer: Self-pay | Admitting: Internal Medicine

## 2016-01-16 MED ORDER — MOMETASONE FURO-FORMOTEROL FUM 100-5 MCG/ACT IN AERO: INHALATION_SPRAY | RESPIRATORY_TRACT | Status: DC

## 2016-01-16 MED ORDER — MOMETASONE FURO-FORMOTEROL FUM 100-5 MCG/ACT IN AERO
INHALATION_SPRAY | RESPIRATORY_TRACT | Status: DC
Start: 2016-01-16 — End: 2016-01-16

## 2016-01-16 NOTE — Telephone Encounter (Signed)
Pt called back and I have him scheduled for a transfer appt. He is hoping to still have [facility] [address] [facility]

## 2016-01-16 NOTE — Addendum Note (Signed)
Addended by: Deatra JamesBRAND, Loki Wuthrich M on: 01/16/2016 09:54 AM   Modules accepted: Orders

## 2016-01-16 NOTE — Telephone Encounter (Signed)
[facility] to [facility] until appt.Marland Kitchen.Raechel Chute/lmb

## 2016-02-02 ENCOUNTER — Ambulatory Visit: Payer: Medicare Other | Admitting: Internal Medicine

## 2016-04-23 ENCOUNTER — Encounter: Payer: Self-pay | Admitting: Internal Medicine

## 2016-04-23 ENCOUNTER — Ambulatory Visit (INDEPENDENT_AMBULATORY_CARE_PROVIDER_SITE_OTHER): Payer: Medicare Other | Admitting: Internal Medicine

## 2016-04-23 VITALS — BP 122/80 | HR 84 | Temp 99.0°F | Resp 16 | Ht 70.0 in | Wt 189.0 lb

## 2016-04-23 DIAGNOSIS — Z0000 Encounter for general adult medical examination without abnormal findings: Secondary | ICD-10-CM

## 2016-04-23 DIAGNOSIS — J45909 Unspecified asthma, uncomplicated: Secondary | ICD-10-CM | POA: Diagnosis not present

## 2016-04-23 DIAGNOSIS — F819 Developmental disorder of scholastic skills, unspecified: Secondary | ICD-10-CM

## 2016-04-23 MED ORDER — MOMETASONE FURO-FORMOTEROL FUM 100-5 MCG/ACT IN AERO: INHALATION_SPRAY | RESPIRATORY_TRACT | Status: DC

## 2016-04-23 MED ORDER — ALBUTEROL SULFATE HFA 108 (90 BASE) MCG/ACT IN AERS: 2.0000 | INHALATION_SPRAY | RESPIRATORY_TRACT | Status: DC | PRN

## 2016-04-23 NOTE — Patient Instructions (Signed)
We have sent in refills today of the medicines and do not need any blood work today.   Health Maintenance, Male A healthy lifestyle and preventative care can promote health and wellness.  Maintain regular health, dental, and eye exams.  Eat a healthy diet. Foods like vegetables, fruits, whole grains, low-fat dairy products, and lean protein foods contain the nutrients you need and are low in calories. Decrease your intake of foods high in solid fats, added sugars, and salt. Get information about a proper diet from your health care provider, if necessary.  Regular physical exercise is one of the most important things you can do for your health. Most adults should get at least 150 minutes of moderate-intensity exercise (any activity that increases your heart rate and causes you to sweat) each week. In addition, most adults need muscle-strengthening exercises on 2 or more days a week.   Maintain a healthy weight. The body mass index (BMI) is a screening tool to identify possible weight problems. It provides an estimate of body fat based on height and weight. Your health care provider can find your BMI and can help you achieve or maintain a healthy weight. For males 20 years and older:  A BMI below 18.5 is considered underweight.  A BMI of 18.5 to 24.9 is normal.  A BMI of 25 to 29.9 is considered overweight.  A BMI of 30 and above is considered obese.  Maintain normal blood lipids and cholesterol by exercising and minimizing your intake of saturated fat. Eat a balanced diet with plenty of fruits and vegetables. Blood tests for lipids and cholesterol should begin at age 30 and be repeated every 5 years. If your lipid or cholesterol levels are high, you are over age 30, or you are at high risk for heart disease, you may need your cholesterol levels checked more frequently.Ongoing high lipid and cholesterol levels should be treated with medicines if diet and exercise are not working.  If you  smoke, find out from your health care provider how to quit. If you do not use tobacco, do not start.  Lung cancer screening is recommended for adults aged 55-80 years who are at high risk for developing lung cancer because of a history of smoking. A yearly low-dose CT scan of the lungs is recommended for people who have at least a 30-pack-year history of smoking and are current smokers or have quit within the past 15 years. A pack year of smoking is smoking an average of 1 pack of cigarettes a day for 1 year (for example, a 30-pack-year history of smoking could mean smoking 1 pack a day for 30 years or 2 packs a day for 15 years). Yearly screening should continue until the smoker has stopped smoking for at least 15 years. Yearly screening should be stopped for people who develop a health problem that would prevent them from having lung cancer treatment.  If you choose to drink alcohol, do not have more than 2 drinks per day. One drink is considered to be 12 oz (360 mL) of beer, 5 oz (150 mL) of wine, or 1.5 oz (45 mL) of liquor.  Avoid the use of street drugs. Do not share needles with anyone. Ask for help if you need support or instructions about stopping the use of drugs.  High blood pressure causes heart disease and increases the risk of stroke. High blood pressure is more likely to develop in:  People who have blood pressure in the end of the  normal range (100-139/85-89 mm Hg).  People who are overweight or obese.  People who are African American.  If you are 77-41 years of age, have your blood pressure checked every 3-5 years. If you are 34 years of age or older, have your blood pressure checked every year. You should have your blood pressure measured twice--once when you are at a hospital or clinic, and once when you are not at a hospital or clinic. Record the average of the two measurements. To check your blood pressure when you are not at a hospital or clinic, you can use:  An automated  blood pressure machine at a pharmacy.  A home blood pressure monitor.  If you are 34-58 years old, ask your health care provider if you should take aspirin to prevent heart disease.  Diabetes screening involves taking a blood sample to check your fasting blood sugar level. This should be done once every 3 years after age 36 if you are at a normal weight and without risk factors for diabetes. Testing should be considered at a younger age or be carried out more frequently if you are overweight and have at least 1 risk factor for diabetes.  Colorectal cancer can be detected and often prevented. Most routine colorectal cancer screening begins at the age of 48 and continues through age 26. However, your health care provider may recommend screening at an earlier age if you have risk factors for colon cancer. On a yearly basis, your health care provider may provide home test kits to check for hidden blood in the stool. A small camera at the end of a tube may be used to directly examine the colon (sigmoidoscopy or colonoscopy) to detect the earliest forms of colorectal cancer. Talk to your health care provider about this at age 32 when routine screening begins. A direct exam of the colon should be repeated every 5-10 years through age 31, unless early forms of precancerous polyps or small growths are found.  People who are at an increased risk for hepatitis B should be screened for this virus. You are considered at high risk for hepatitis B if:  You were born in a country where hepatitis B occurs often. Talk with your health care provider about which countries are considered high risk.  Your parents were born in a high-risk country and you have not received a shot to protect against hepatitis B (hepatitis B vaccine).  You have HIV or AIDS.  You use needles to inject street drugs.  You live with, or have sex with, someone who has hepatitis B.  You are a man who has sex with other men (MSM).  You get  hemodialysis treatment.  You take certain medicines for conditions like cancer, organ transplantation, and autoimmune conditions.  Hepatitis C blood testing is recommended for all people born from [birth year] through 1965 and any individual with known risk factors for hepatitis C.  Healthy men should no longer receive prostate-specific antigen (PSA) blood tests as part of routine cancer screening. Talk to your health care provider about prostate cancer screening.  Testicular cancer screening is not recommended for adolescents or adult males who have no symptoms. Screening includes self-exam, a health care provider exam, and other screening tests. Consult with your health care provider about any symptoms you have or any concerns you have about testicular cancer.  Practice safe sex. Use condoms and avoid high-risk sexual practices to reduce the spread of sexually transmitted infections (STIs).  You should be screened for  STIs, including gonorrhea and chlamydia if:  You are sexually active and are younger than 24 years.  You are older than 24 years, and your health care provider tells you that you are at risk for this type of infection.  Your sexual activity has changed since you were last screened, and you are at an increased risk for chlamydia or gonorrhea. Ask your health care provider if you are at risk.  If you are at risk of being infected with HIV, it is recommended that you take a prescription medicine daily to prevent HIV infection. This is called pre-exposure prophylaxis (PrEP). You are considered at risk if:  You are a man who has sex with other men (MSM).  You are a heterosexual man who is sexually active with multiple partners.  You take drugs by injection.  You are sexually active with a partner who has HIV.  Talk with your health care provider about whether you are at high risk of being infected with HIV. If you choose to begin PrEP, you should first be tested for HIV. You should  then be tested every 3 months for as long as you are taking PrEP.  Use sunscreen. Apply sunscreen liberally and repeatedly throughout the day. You should seek shade when your shadow is shorter than you. Protect yourself by wearing long sleeves, pants, a wide-brimmed hat, and sunglasses year round whenever you are outdoors.  Tell your health care provider of new moles or changes in moles, especially if there is a change in shape or color. Also, tell your health care provider if a mole is larger than the size of a pencil eraser.  A one-time screening for abdominal aortic aneurysm (AAA) and surgical repair of large AAAs by ultrasound is recommended for men aged 45-75 years who are current or former smokers.  Stay current with your vaccines (immunizations).   This information is not intended to replace advice given to you by your health care provider. Make sure you discuss any questions you have with your health care provider.   Document Released: 04/26/2008 Document Revised: 11/19/2014 Document Reviewed: 03/26/2011 Elsevier Interactive Patient Education [employer].

## 2016-04-23 NOTE — Progress Notes (Signed)
Pre visit review using our clinic review tool, if applicable. No additional management support is needed unless otherwise documented below in the visit note. 

## 2016-04-24 ENCOUNTER — Encounter: Payer: Self-pay | Admitting: Internal Medicine

## 2016-04-24 DIAGNOSIS — Z0000 Encounter for general adult medical examination without abnormal findings: Secondary | ICD-10-CM | POA: Insufficient documentation

## 2016-04-24 NOTE — Assessment & Plan Note (Signed)
Able to live independently although he does need a quiet environment to do his work. Used to take meds but does not need anymore due to behavioral changes.

## 2016-04-24 NOTE — Assessment & Plan Note (Signed)
Taking dulera daily and albuterol rare. No flare today and none since last visit.

## 2016-04-24 NOTE — Progress Notes (Signed)
   Subjective:    Patient ID: Jose Sampson, male    DOB: 1986-05-30, 30 y.o.   MRN: [id number]  HPI Here for medicare wellness, no new complaints. Please see A/P for status and treatment of chronic medical problems.   Diet: heart healthy Physical activity: active Depression/mood screen: negative Hearing: intact to whispered voice Visual acuity: grossly normal, performs annual eye exam  ADLs: capable Fall risk: none Home safety: good Cognitive evaluation: intact to orientation, naming, recall and repetition EOL planning: adv directives discussed  I have personally reviewed and have noted 1. The patient's medical and social history - reviewed today no changes 2. Their use of alcohol, tobacco or illicit drugs 3. Their current medications and supplements 4. The patient's functional ability including ADL's, fall risks, home safety risks and hearing or visual impairment. 5. Diet and physical activities 6. Evidence for depression or mood disorders 7. Care team reviewed and updated (available in snapshot)  Review of Systems  Constitutional: Negative for fever, activity change, appetite change and fatigue.  HENT: Negative.   Eyes: Negative.   Respiratory: Negative for cough, chest tightness and shortness of breath.   Cardiovascular: Negative for chest pain, palpitations and leg swelling.  Gastrointestinal: Negative for abdominal pain, diarrhea, constipation and abdominal distention.  Musculoskeletal: Negative.   Skin: Negative.   Neurological: Negative for dizziness, syncope, weakness, light-headedness and headaches.  Psychiatric/Behavioral: Negative.       Objective:   Physical Exam  Constitutional: He is oriented to person, place, and time. He appears well-developed and well-nourished.  HENT:  Head: Normocephalic and atraumatic.  Eyes: EOM are normal.  Neck: Normal range of motion.  Cardiovascular: Normal rate and regular rhythm.   Pulmonary/Chest: Effort normal and breath  sounds normal. No respiratory distress. He has no wheezes. He has no rales.  Abdominal: Soft. Bowel sounds are normal. He exhibits no distension. There is no tenderness. There is no rebound.  Musculoskeletal: He exhibits no edema.  Neurological: He is alert and oriented to person, place, and time. Coordination normal.  Skin: Skin is warm and dry.  Psychiatric: He has a normal mood and affect.   Filed Vitals:   04/23/16 1602  BP: 122/80  Pulse: 84  Temp: 99 F (37.2 C)  TempSrc: Oral  Resp: 16  Height: 5\' 10"  (1.778 m)  Weight: 189 lb (85.73 kg)  SpO2: 98%      Assessment & Plan:

## 2016-04-24 NOTE — Assessment & Plan Note (Signed)
Immunizations up to date, talked to him about the need for yearly flu shot. He is active and non-smoker. Counseled on need for exercise to help. Given 10 year screening recommendations.

## 2016-08-15 ENCOUNTER — Other Ambulatory Visit: Payer: Self-pay | Admitting: Internal Medicine

## 2016-11-20 ENCOUNTER — Other Ambulatory Visit: Payer: Self-pay | Admitting: Internal Medicine

## 2016-12-18 ENCOUNTER — Other Ambulatory Visit: Payer: Self-pay | Admitting: *Deleted

## 2016-12-18 MED ORDER — MOMETASONE FURO-FORMOTEROL FUM 100-5 MCG/ACT IN AERO: INHALATION_SPRAY | RESPIRATORY_TRACT | 5 refills | Status: DC

## 2016-12-19 ENCOUNTER — Telehealth: Payer: Self-pay | Admitting: *Deleted

## 2016-12-19 NOTE — Telephone Encounter (Signed)
I assume you mean proair. None of the listed meds are actually alternative to albuterol. Only proventil is an alternative to proair. Is this covered?

## 2016-12-19 NOTE — Telephone Encounter (Signed)
Rec'd call from [facility] stating pt Praair inhaler is no longer covered by insurance the alternatives are Advair, Symbicort, Breo, and any other HFA...Raechel Chute/lmb

## 2016-12-19 NOTE — Telephone Encounter (Signed)
Correction pharmacy tech gave wrong inhaler. [facility] or can change to Advair 250/500 , symbicort 160  Breo, or Advair 112/21...Raechel Chute/lmb

## 2016-12-20 MED ORDER — FLUTICASONE FUROATE-VILANTEROL 100-25 MCG/INH IN AEPB: 1.0000 | INHALATION_SPRAY | Freq: Every day | RESPIRATORY_TRACT | 6 refills | Status: DC

## 2016-12-20 NOTE — Telephone Encounter (Signed)
Changed to breo 

## 2017-02-26 ENCOUNTER — Other Ambulatory Visit: Payer: Self-pay | Admitting: Internal Medicine

## 2017-09-26 ENCOUNTER — Other Ambulatory Visit: Payer: Self-pay | Admitting: Internal Medicine

## 2017-09-30 ENCOUNTER — Encounter: Payer: Self-pay | Admitting: Internal Medicine

## 2017-09-30 ENCOUNTER — Ambulatory Visit (INDEPENDENT_AMBULATORY_CARE_PROVIDER_SITE_OTHER): Payer: Medicare Other | Admitting: Internal Medicine

## 2017-09-30 VITALS — BP 128/84 | HR 81 | Temp 98.2°F | Ht 70.0 in | Wt 200.0 lb

## 2017-09-30 DIAGNOSIS — Z0000 Encounter for general adult medical examination without abnormal findings: Secondary | ICD-10-CM | POA: Diagnosis not present

## 2017-09-30 DIAGNOSIS — J4520 Mild intermittent asthma, uncomplicated: Secondary | ICD-10-CM | POA: Diagnosis not present

## 2017-09-30 MED ORDER — ALBUTEROL SULFATE HFA 108 (90 BASE) MCG/ACT IN AERS: INHALATION_SPRAY | RESPIRATORY_TRACT | 3 refills | Status: DC

## 2017-09-30 MED ORDER — FLUTICASONE FUROATE-VILANTEROL 100-25 MCG/INH IN AEPB: 1.0000 | INHALATION_SPRAY | Freq: Every day | RESPIRATORY_TRACT | 11 refills | Status: DC

## 2017-09-30 NOTE — Assessment & Plan Note (Signed)
Flu shot given at visit, tetanus up to date. Declines need for HIV screening. Counseled about safety and sun screening. Given 10 year screening recommendations.

## 2017-09-30 NOTE — Patient Instructions (Addendum)
We have given you the flu shot today.  Come back next year to check in as well.    Health Maintenance, Male A healthy lifestyle and preventive care is important for your health and wellness. Ask your health care provider about what schedule of regular examinations is right for you. What should I know about weight and diet? Eat a Healthy Diet  Eat plenty of vegetables, fruits, whole grains, low-fat dairy products, and lean protein.  Do not eat a lot of foods high in solid fats, added sugars, or salt.  Maintain a Healthy Weight Regular exercise can help you achieve or maintain a healthy weight. You should:  Do at least 150 minutes of exercise each week. The exercise should increase your heart rate and make you sweat (moderate-intensity exercise).  Do strength-training exercises at least twice a week.  Watch Your Levels of Cholesterol and Blood Lipids  Have your blood tested for lipids and cholesterol every 5 years starting at 31 years of age. If you are at high risk for heart disease, you should start having your blood tested when you are 31 years old. You may need to have your cholesterol levels checked more often if: ? Your lipid or cholesterol levels are high. ? You are older than 31 years of age. ? You are at high risk for heart disease.  What should I know about cancer screening? Many types of cancers can be detected early and may often be prevented. Lung Cancer  You should be screened every year for lung cancer if: ? You are a current smoker who has smoked for at least 30 years. ? You are a former smoker who has quit within the past 15 years.  Talk to your health care provider about your screening options, when you should start screening, and how often you should be screened.  Colorectal Cancer  Routine colorectal cancer screening usually begins at 31 years of age and should be repeated every 5-10 years until you are 31 years old. You may need to be screened more often if  early forms of precancerous polyps or small growths are found. Your health care provider may recommend screening at an earlier age if you have risk factors for colon cancer.  Your health care provider may recommend using home test kits to check for hidden blood in the stool.  A small camera at the end of a tube can be used to examine your colon (sigmoidoscopy or colonoscopy). This checks for the earliest forms of colorectal cancer.  Prostate and Testicular Cancer  Depending on your age and overall health, your health care provider may do certain tests to screen for prostate and testicular cancer.  Talk to your health care provider about any symptoms or concerns you have about testicular or prostate cancer.  Skin Cancer  Check your skin from head to toe regularly.  Tell your health care provider about any new moles or changes in moles, especially if: ? There is a change in a mole's size, shape, or color. ? You have a mole that is larger than a pencil eraser.  Always use sunscreen. Apply sunscreen liberally and repeat throughout the day.  Protect yourself by wearing long sleeves, pants, a wide-brimmed hat, and sunglasses when outside.  What should I know about heart disease, diabetes, and high blood pressure?  If you are 2018-31 years of age, have your blood pressure checked every 3-5 years. If you are 31 years of age or older, have your blood pressure checked  every year. You should have your blood pressure measured twice-once when you are at a hospital or clinic, and once when you are not at a hospital or clinic. Record the average of the two measurements. To check your blood pressure when you are not at a hospital or clinic, you can use: ? An automated blood pressure machine at a pharmacy. ? A home blood pressure monitor.  Talk to your health care provider about your target blood pressure.  If you are between 51-35 years old, ask your health care provider if you should take aspirin to  prevent heart disease.  Have regular diabetes screenings by checking your fasting blood sugar level. ? If you are at a normal weight and have a low risk for diabetes, have this test once every three years after the age of 54. ? If you are overweight and have a high risk for diabetes, consider being tested at a younger age or more often.  A one-time screening for abdominal aortic aneurysm (AAA) by ultrasound is recommended for men aged 57-75 years who are current or former smokers. What should I know about preventing infection? Hepatitis B If you have a higher risk for hepatitis B, you should be screened for this virus. Talk with your health care provider to find out if you are at risk for hepatitis B infection. Hepatitis C Blood testing is recommended for:  Everyone born from [birth year] through 1965.  Anyone with known risk factors for hepatitis C.  Sexually Transmitted Diseases (STDs)  You should be screened each year for STDs including gonorrhea and chlamydia if: ? You are sexually active and are younger than 31 years of age. ? You are older than 31 years of age and your health care provider tells you that you are at risk for this type of infection. ? Your sexual activity has changed since you were last screened and you are at an increased risk for chlamydia or gonorrhea. Ask your health care provider if you are at risk.  Talk with your health care provider about whether you are at high risk of being infected with HIV. Your health care provider may recommend a prescription medicine to help prevent HIV infection.  What else can I do?  Schedule regular health, dental, and eye exams.  Stay current with your vaccines (immunizations).  Do not use any tobacco products, such as cigarettes, chewing tobacco, and e-cigarettes. If you need help quitting, ask your health care provider.  Limit alcohol intake to no more than 2 drinks per day. One drink equals 12 ounces of beer, 5 ounces of wine, or  1 ounces of hard liquor.  Do not use street drugs.  Do not share needles.  Ask your health care provider for help if you need support or information about quitting drugs.  Tell your health care provider if you often feel depressed.  Tell your health care provider if you have ever been abused or do not feel safe at home. This information is not intended to replace advice given to you by your health care provider. Make sure you discuss any questions you have with your health care provider. Document Released: 04/26/2008 Document Revised: 06/27/2016 Document Reviewed: 08/02/2015 Elsevier Interactive Patient Education  [employer].

## 2017-09-30 NOTE — Progress Notes (Signed)
   Subjective:    Patient ID: Jose Sampson, male    DOB: [birth year], 31 y.o.   MRN: [id number]  HPI Here for medicare wellness, no new complaints. Please see A/P for status and treatment of chronic medical problems.   HPI #2: Here for follow up of asthma, doing well on breo. Switched at last visit. He is finding it easier to use. No flares since last visit. Walking daily and biking. Is disabled and does not work. Denies SOB or cough or allergies. Denies chest pains or abdominal pain.  Diet: heart healthy Physical activity: active Depression/mood screen: negative Hearing: intact to whispered voice Visual acuity: grossly normal, performs annual eye exam  ADLs: capable Fall risk: none Home safety: good Cognitive evaluation: intact to orientation, naming, recall and repetition EOL planning: adv directives discussed, not in place  I have personally reviewed and have noted 1. The patient's medical and social history - reviewed today no changes 2. Their use of alcohol, tobacco or illicit drugs 3. Their current medications and supplements 4. The patient's functional ability including ADL's, fall risks, home safety risks and hearing or visual impairment. 5. Diet and physical activities 6. Evidence for depression or mood disorders 7. Care team reviewed and updated (available in snapshot)  Review of Systems  Constitutional: Negative.   HENT: Negative.   Eyes: Negative.   Respiratory: Negative for cough, chest tightness and shortness of breath.   Cardiovascular: Negative for chest pain, palpitations and leg swelling.  Gastrointestinal: Negative for abdominal distention, abdominal pain, constipation, diarrhea, nausea and vomiting.  Musculoskeletal: Negative.   Skin: Negative.   Neurological: Negative.   Psychiatric/Behavioral: Negative.       Objective:   Physical Exam  Constitutional: He is oriented to person, place, and time. He appears well-developed and well-nourished.  HENT:    Head: Normocephalic and atraumatic.  Eyes: EOM are normal.  Neck: Normal range of motion.  Cardiovascular: Normal rate and regular rhythm.  Pulmonary/Chest: Effort normal and breath sounds normal. No respiratory distress. He has no wheezes. He has no rales.  Abdominal: Soft. Bowel sounds are normal. He exhibits no distension. There is no tenderness. There is no rebound.  Musculoskeletal: He exhibits no edema.  Neurological: He is alert and oriented to person, place, and time. Coordination normal.  Skin: Skin is warm and dry.  Psychiatric: He has a normal mood and affect.   Vitals:   09/30/17 1311  BP: 128/84  Pulse: 81  Temp: 98.2 F (36.8 C)  TempSrc: Oral  SpO2: 99%  Weight: 200 lb (90.7 kg)  Height: 5\' 10"  (1.778 m)      Assessment & Plan:

## 2017-09-30 NOTE — Assessment & Plan Note (Signed)
Doing well on the breo. Albuterol as needed. Refilled today.

## 2018-08-27 NOTE — Telephone Encounter (Signed)
error 

## 2018-10-17 ENCOUNTER — Encounter: Payer: Self-pay | Admitting: Internal Medicine

## 2018-10-17 ENCOUNTER — Ambulatory Visit (INDEPENDENT_AMBULATORY_CARE_PROVIDER_SITE_OTHER): Payer: Medicare Other | Admitting: Internal Medicine

## 2018-10-17 VITALS — BP 110/70 | HR 79 | Temp 97.9°F | Ht 70.0 in | Wt 216.0 lb

## 2018-10-17 DIAGNOSIS — Z0000 Encounter for general adult medical examination without abnormal findings: Secondary | ICD-10-CM | POA: Diagnosis not present

## 2018-10-17 DIAGNOSIS — J4520 Mild intermittent asthma, uncomplicated: Secondary | ICD-10-CM

## 2018-10-17 DIAGNOSIS — F819 Developmental disorder of scholastic skills, unspecified: Secondary | ICD-10-CM | POA: Diagnosis not present

## 2018-10-17 DIAGNOSIS — Z23 Encounter for immunization: Secondary | ICD-10-CM

## 2018-10-17 MED ORDER — FLUTICASONE FUROATE-VILANTEROL 100-25 MCG/INH IN AEPB: 1.0000 | INHALATION_SPRAY | Freq: Every day | RESPIRATORY_TRACT | 11 refills | Status: DC

## 2018-10-17 MED ORDER — ALBUTEROL SULFATE HFA 108 (90 BASE) MCG/ACT IN AERS: INHALATION_SPRAY | RESPIRATORY_TRACT | 3 refills | Status: DC

## 2018-10-17 NOTE — Assessment & Plan Note (Signed)
Controlled no flare at this time. Continue breo and albuterol prn. Flu shot given.

## 2018-10-17 NOTE — Assessment & Plan Note (Signed)
Is on disability. He is able to enjoy drawing although is not overly social. His family helps with shopping and finances but he is otherwise able to live independently.

## 2018-10-17 NOTE — Patient Instructions (Addendum)

## 2018-10-17 NOTE — Assessment & Plan Note (Signed)
Flu shot given. Tetanus up to date. Counseled about sun safety and mole surveillance. Counseled about the dangers of distracted driving. Given 10 year screening recommendations.

## 2018-10-17 NOTE — Progress Notes (Signed)
   Subjective:    Patient ID: Jose Sampson, male    DOB: 12-Feb-1986, 32 y.o.   MRN: [id number]  HPI Here for medicare wellness, no new complaints. Please see A/P for status and treatment of chronic medical problems.  HPI #2: Here for follow up asthma (taking breo and no flares since last visit, denies problems, denies cough or fevers, exercising when able).   Diet: heart healthy Physical activity: sedentary Depression/mood screen: negative Hearing: intact to whispered voice Visual acuity: grossly normal, performs annual eye exam  ADLs: capable Fall risk: none Home safety: good Cognitive evaluation: intact to orientation, naming, recall and repetition EOL planning: adv directives discussed  I have personally reviewed and have noted 1. The patient's medical and social history - reviewed today no changes 2. Their use of alcohol, tobacco or illicit drugs 3. Their current medications and supplements 4. The patient's functional ability including ADL's, fall risks, home safety risks and hearing or visual impairment. 5. Diet and physical activities 6. Evidence for depression or mood disorders 7. Care team reviewed and updated (available in snapshot)  Review of Systems  Constitutional: Negative.   HENT: Negative.   Eyes: Negative.   Respiratory: Negative for cough, chest tightness and shortness of breath.   Cardiovascular: Negative for chest pain, palpitations and leg swelling.  Gastrointestinal: Negative for abdominal distention, abdominal pain, constipation, diarrhea, nausea and vomiting.  Musculoskeletal: Negative.   Skin: Negative.   Neurological: Negative.   Psychiatric/Behavioral: Negative.       Objective:   Physical Exam  Constitutional: He is oriented to person, place, and time. He appears well-developed and well-nourished.  HENT:  Head: Normocephalic and atraumatic.  Eyes: EOM are normal.  Neck: Normal range of motion.  Cardiovascular: Normal rate and regular rhythm.   Pulmonary/Chest: Effort normal and breath sounds normal. No respiratory distress. He has no wheezes. He has no rales.  Abdominal: Soft. Bowel sounds are normal. He exhibits no distension. There is no tenderness. There is no rebound.  Musculoskeletal: He exhibits no edema.  Neurological: He is alert and oriented to person, place, and time. Coordination normal.  Skin: Skin is warm and dry.  Psychiatric: He has a normal mood and affect.   Vitals:   10/17/18 1428  BP: 110/70  Pulse: 79  Temp: 97.9 F (36.6 C)  TempSrc: Oral  SpO2: 99%  Weight: 216 lb (98 kg)  Height: 5\' 10"  (1.778 m)      Assessment & Plan:  Flu shot given

## 2019-10-30 ENCOUNTER — Ambulatory Visit: Payer: Medicare Other | Admitting: Internal Medicine

## 2019-11-03 ENCOUNTER — Other Ambulatory Visit: Payer: Self-pay

## 2019-11-03 ENCOUNTER — Ambulatory Visit (INDEPENDENT_AMBULATORY_CARE_PROVIDER_SITE_OTHER): Payer: Medicare Other | Admitting: Internal Medicine

## 2019-11-03 ENCOUNTER — Encounter: Payer: Self-pay | Admitting: Internal Medicine

## 2019-11-03 VITALS — BP 126/82 | HR 100 | Temp 98.5°F | Ht 70.0 in | Wt 237.0 lb

## 2019-11-03 DIAGNOSIS — F819 Developmental disorder of scholastic skills, unspecified: Secondary | ICD-10-CM | POA: Diagnosis not present

## 2019-11-03 DIAGNOSIS — J4520 Mild intermittent asthma, uncomplicated: Secondary | ICD-10-CM

## 2019-11-03 DIAGNOSIS — Z23 Encounter for immunization: Secondary | ICD-10-CM

## 2019-11-03 DIAGNOSIS — Z0000 Encounter for general adult medical examination without abnormal findings: Secondary | ICD-10-CM

## 2019-11-03 MED ORDER — BREO ELLIPTA 100-25 MCG/INH IN AEPB: 1.0000 | INHALATION_SPRAY | Freq: Every day | RESPIRATORY_TRACT | 11 refills | Status: DC

## 2019-11-03 MED ORDER — ALBUTEROL SULFATE HFA 108 (90 BASE) MCG/ACT IN AERS: INHALATION_SPRAY | RESPIRATORY_TRACT | 3 refills | Status: DC

## 2019-11-03 NOTE — Assessment & Plan Note (Signed)
Doing well with breo and albuterol. Suspect breo is more prn but will refill for 1 year. No flare today and exercising regularly and non-smoker. Counseled about covid-19 vaccine and given flu vaccine.

## 2019-11-03 NOTE — Progress Notes (Signed)
Subjective:   Patient ID: Jose Sampson, male    DOB: 1986/10/05, 33 y.o.   MRN: [id number]  HPI Here for medicare wellness, no new complaints. Please see A/P for status and treatment of chronic medical problems.   HPI #2: Here for follow up asthma (has breo and albuterol, using those but without regularity on breo, rare albuterol, denies SOB or cough, denies night time awakenings with SOB, exercising inside since covid-19 but still staying active).   Diet: heart healthy Physical activity: active Depression/mood screen: negative Hearing: intact to whispered voice Visual acuity: grossly normal, performs annual eye exam  ADLs: capable Fall risk: none Home safety: good Cognitive evaluation: intact to orientation, naming, recall and repetition EOL planning: adv directives discussed    Office Visit from 11/03/2019 in [facility] Total Score  0      I have personally reviewed and have noted 1. The patient's medical and social history - reviewed today no changes 2. Their use of alcohol, tobacco or illicit drugs 3. Their current medications and supplements 4. The patient's functional ability including ADL's, fall risks, home safety risks and hearing or visual impairment. 5. Diet and physical activities 6. Evidence for depression or mood disorders 7. Care team reviewed and updated  Patient Care Team: Hoyt Koch, MD as PCP - General (Internal Medicine) Tanda Rockers, MD (Pulmonary Disease) Past Medical History:  Diagnosis Date  . Asthma   . Learning disability   . Speech impediment    Past Surgical History:  Procedure Laterality Date  . NO PAST SURGERIES     Family History  Problem Relation Age of Onset  . Arthritis Mother   . Arthritis Father   . Hypertension Other   . Diabetes Other   . Asthma Brother   . Ovarian cancer Maternal Grandmother   . Breast cancer Paternal Aunt     Review of Systems  Constitutional: Negative.     HENT: Negative.   Eyes: Negative.   Respiratory: Negative for cough, chest tightness and shortness of breath.   Cardiovascular: Negative for chest pain, palpitations and leg swelling.  Gastrointestinal: Negative for abdominal distention, abdominal pain, constipation, diarrhea, nausea and vomiting.  Musculoskeletal: Negative.   Skin: Negative.   Neurological: Negative.   Psychiatric/Behavioral: Negative.     Objective:  Physical Exam Constitutional:      Appearance: He is well-developed.  HENT:     Head: Normocephalic and atraumatic.  Cardiovascular:     Rate and Rhythm: Normal rate and regular rhythm.  Pulmonary:     Effort: Pulmonary effort is normal. No respiratory distress.     Breath sounds: Normal breath sounds. No wheezing or rales.  Abdominal:     General: Bowel sounds are normal. There is no distension.     Palpations: Abdomen is soft.     Tenderness: There is no abdominal tenderness. There is no rebound.  Musculoskeletal:     Cervical back: Normal range of motion.  Skin:    General: Skin is warm and dry.  Neurological:     Mental Status: He is alert and oriented to person, place, and time.     Coordination: Coordination normal.     Vitals:   11/03/19 1324  BP: 126/82  Pulse: 100  Temp: 98.5 F (36.9 C)  TempSrc: Oral  SpO2: 97%  Weight: 237 lb (107.5 kg)  Height: 5\' 10"  (1.778 m)   This visit occurred during the SARS-CoV-2 public health emergency.  Safety protocols were in place, including screening questions prior to the visit, additional usage of staff PPE, and extensive cleaning of exam room while observing appropriate contact time as indicated for disinfecting solutions.   Assessment & Plan:  Flu shot given at visit

## 2019-11-03 NOTE — Assessment & Plan Note (Signed)
Lives independently with help from family mostly for finances and shopping.

## 2019-11-03 NOTE — Patient Instructions (Signed)

## 2019-11-03 NOTE — Assessment & Plan Note (Signed)
Flu shot given. Pneumonia up to date until 60. Tetanus due 2025. Counseled about sun safety and mole surveillance. Counseled about the dangers of distracted driving. Given 10 year screening recommendations.

## 2020-10-12 ENCOUNTER — Telehealth: Payer: Self-pay | Admitting: Internal Medicine

## 2020-10-12 MED ORDER — ALBUTEROL SULFATE HFA 108 (90 BASE) MCG/ACT IN AERS: 2.0000 | INHALATION_SPRAY | RESPIRATORY_TRACT | 1 refills | Status: DC | PRN

## 2020-10-12 MED ORDER — BREO ELLIPTA 100-25 MCG/INH IN AEPB: 1.0000 | INHALATION_SPRAY | Freq: Every day | RESPIRATORY_TRACT | 1 refills | Status: DC

## 2020-10-12 NOTE — Telephone Encounter (Signed)
Refills sent. Office visit is due. See meds.

## 2020-10-12 NOTE — Telephone Encounter (Signed)
   1.Medication Requested: albuterol (VENTOLIN HFA) 108 (90 Base) MCG/ACT inhaler fluticasone furoate-vilanterol (BREO ELLIPTA) 100-25 MCG/INH AEPB  2. Pharmacy (Name, Street, [place]): [facility] [id number] - [place], [place] - [address]  3. On Med List: yes  4. Last Visit with PCP: 11/03/19  5. Next visit date with PCP: n/a   Agent: Please be advised that RX refills may take up to 3 business days. We ask that you follow-up with your pharmacy.

## 2020-11-03 DIAGNOSIS — Z23 Encounter for immunization: Secondary | ICD-10-CM | POA: Diagnosis not present

## 2020-12-21 ENCOUNTER — Telehealth: Payer: Self-pay | Admitting: Internal Medicine

## 2020-12-21 NOTE — Telephone Encounter (Signed)
Left message with Rollene Rotunda to have patient call to schedule AWV and physical

## 2021-01-08 ENCOUNTER — Other Ambulatory Visit: Payer: Self-pay | Admitting: Internal Medicine

## 2021-02-09 ENCOUNTER — Other Ambulatory Visit: Payer: Self-pay | Admitting: Internal Medicine

## 2021-02-27 ENCOUNTER — Ambulatory Visit (INDEPENDENT_AMBULATORY_CARE_PROVIDER_SITE_OTHER): Payer: Medicare Other | Admitting: Internal Medicine

## 2021-02-27 ENCOUNTER — Encounter: Payer: Medicare Other | Admitting: Internal Medicine

## 2021-02-27 ENCOUNTER — Encounter: Payer: Self-pay | Admitting: Internal Medicine

## 2021-02-27 ENCOUNTER — Other Ambulatory Visit: Payer: Self-pay

## 2021-02-27 VITALS — BP 122/80 | HR 83 | Temp 98.0°F | Resp 18 | Ht 70.0 in | Wt 243.4 lb

## 2021-02-27 DIAGNOSIS — Z0000 Encounter for general adult medical examination without abnormal findings: Secondary | ICD-10-CM

## 2021-02-27 DIAGNOSIS — J4520 Mild intermittent asthma, uncomplicated: Secondary | ICD-10-CM

## 2021-02-27 DIAGNOSIS — F819 Developmental disorder of scholastic skills, unspecified: Secondary | ICD-10-CM | POA: Diagnosis not present

## 2021-02-27 MED ORDER — BREO ELLIPTA 100-25 MCG/INH IN AEPB: 1.0000 | INHALATION_SPRAY | Freq: Every day | RESPIRATORY_TRACT | 3 refills | Status: DC

## 2021-02-27 NOTE — Progress Notes (Signed)
Subjective:   Patient ID: Jose Sampson, male    DOB: 1986/06/25, 35 y.o.   MRN: [id number]  HPI Here for medicare wellness and physical, no new complaints. Please see A/P for status and treatment of chronic medical problems.   Diet: heart healthy Physical activity: sedentary Depression/mood screen: negative Hearing: intact to whispered voice Visual acuity: grossly normal, overdue for annual eye exam  ADLs: capable, needs assistance for some financial decisions and complex tasks Fall risk: none Home safety: good Cognitive evaluation: intact to orientation, naming, recall and repetition EOL planning: adv directives discussed  Flowsheet Row Office Visit from 02/27/2021 in [facility] Total Score 0      I have personally reviewed and have noted 1. The patient's medical and social history - reviewed today no changes 2. Their use of alcohol, tobacco or illicit drugs 3. Their current medications and supplements 4. The patient's functional ability including ADL's, fall risks, home safety risks and hearing or visual impairment. 5. Diet and physical activities 6. Evidence for depression or mood disorders 7. Care team reviewed and updated 8.  The patient is not on an opioid pain medication.  Patient Care Team: Myrlene Broker, MD as PCP - General (Internal Medicine) Nyoka Cowden, MD (Pulmonary Disease) Past Medical History:  Diagnosis Date  . Asthma   . Learning disability   . Speech impediment    Past Surgical History:  Procedure Laterality Date  . NO PAST SURGERIES     Family History  Problem Relation Age of Onset  . Arthritis Mother   . Arthritis Father   . Hypertension Other   . Diabetes Other   . Asthma Brother   . Ovarian cancer Maternal Grandmother   . Breast cancer Paternal Aunt    Review of Systems  Constitutional: Negative.   HENT: Negative.   Eyes: Negative.   Respiratory: Negative for cough, chest tightness and  shortness of breath.   Cardiovascular: Negative for chest pain, palpitations and leg swelling.  Gastrointestinal: Negative for abdominal distention, abdominal pain, constipation, diarrhea, nausea and vomiting.  Musculoskeletal: Negative.   Skin: Negative.   Neurological: Negative.   Psychiatric/Behavioral: Negative.     Objective:  Physical Exam Constitutional:      Appearance: He is well-developed. He is obese.  HENT:     Head: Normocephalic and atraumatic.  Cardiovascular:     Rate and Rhythm: Normal rate and regular rhythm.  Pulmonary:     Effort: Pulmonary effort is normal. No respiratory distress.     Breath sounds: Normal breath sounds. No wheezing or rales.  Abdominal:     General: Bowel sounds are normal. There is no distension.     Palpations: Abdomen is soft.     Tenderness: There is no abdominal tenderness. There is no rebound.  Musculoskeletal:     Cervical back: Normal range of motion.  Skin:    General: Skin is warm and dry.  Neurological:     Mental Status: He is alert and oriented to person, place, and time.     Coordination: Coordination normal.     Vitals:   02/27/21 1552  BP: 122/80  Pulse: 83  Resp: 18  Temp: 98 F (36.7 C)  TempSrc: Oral  SpO2: 99%  Weight: 243 lb 6.4 oz (110.4 kg)  Height: 5\' 10"  (1.778 m)   This visit occurred during the SARS-CoV-2 public health emergency.  Safety protocols were in place, including screening questions prior to the visit,  additional usage of staff PPE, and extensive cleaning of exam room while observing appropriate contact time as indicated for disinfecting solutions.   Assessment & Plan:

## 2021-02-27 NOTE — Patient Instructions (Addendum)
High-Fiber Eating Plan Fiber, also called dietary fiber, is a type of carbohydrate. It is found foods such as fruits, vegetables, whole grains, and beans. A high-fiber diet can have many health benefits. Your health care provider may recommend a high-fiber diet to help:  Prevent constipation. Fiber can make your bowel movements more regular.  Lower your cholesterol.  Relieve the following conditions: ? Inflammation of veins in the anus (hemorrhoids). ? Inflammation of specific areas of the digestive tract (uncomplicated diverticulosis). ? A problem of the large intestine, also called the colon, that sometimes causes pain and diarrhea (irritable bowel syndrome, or IBS).  Prevent overeating as part of a weight-loss plan.  Prevent heart disease, type 2 diabetes, and certain cancers. What are tips for following this plan? Reading food labels  Check the nutrition facts label on food products for the amount of dietary fiber. Choose foods that have 5 grams of fiber or more per serving.  The goals for recommended daily fiber intake include: ? Men (age 50 or younger): 34-38 g. ? Men (over age 50): 28-34 g. ? Women (age 50 or younger): 25-28 g. ? Women (over age 50): 22-25 g. Your daily fiber goal is _____________ g.   Shopping  Choose whole fruits and vegetables instead of processed forms, such as apple juice or applesauce.  Choose a wide variety of high-fiber foods such as avocados, lentils, oats, and kidney beans.  Read the nutrition facts label of the foods you choose. Be aware of foods with added fiber. These foods often have high sugar and sodium amounts per serving. Cooking  Use whole-grain flour for baking and cooking.  Cook with brown rice instead of white rice. Meal planning  Start the day with a breakfast that is high in fiber, such as a cereal that contains 5 g of fiber or more per serving.  Eat breads and cereals that are made with whole-grain flour instead of refined  flour or white flour.  Eat brown rice, bulgur wheat, or millet instead of white rice.  Use beans in place of meat in soups, salads, and pasta dishes.  Be sure that half of the grains you eat each day are whole grains. General information  You can get the recommended daily intake of dietary fiber by: ? Eating a variety of fruits, vegetables, grains, nuts, and beans. ? Taking a fiber supplement if you are not able to take in enough fiber in your diet. It is better to get fiber through food than from a supplement.  Gradually increase how much fiber you consume. If you increase your intake of dietary fiber too quickly, you may have bloating, cramping, or gas.  Drink plenty of water to help you digest fiber.  Choose high-fiber snacks, such as berries, raw vegetables, nuts, and popcorn. What foods should I eat? Fruits Berries. Pears. Apples. Oranges. Avocado. Prunes and raisins. Dried figs. Vegetables Sweet potatoes. Spinach. Kale. Artichokes. Cabbage. Broccoli. Cauliflower. Green peas. Carrots. Squash. Grains Whole-grain breads. Multigrain cereal. Oats and oatmeal. Brown rice. Barley. Bulgur wheat. Millet. Quinoa. Bran muffins. Popcorn. Rye wafer crackers. Meats and other proteins Navy beans, kidney beans, and pinto beans. Soybeans. Split peas. Lentils. Nuts and seeds. Dairy Fiber-fortified yogurt. Beverages Fiber-fortified soy milk. Fiber-fortified orange juice. Other foods Fiber bars. The items listed above may not be a complete list of recommended foods and beverages. Contact a dietitian for more information. What foods should I avoid? Fruits Fruit juice. Cooked, strained fruit. Vegetables Fried potatoes. Canned vegetables. Well-cooked vegetables. Grains   White bread. Pasta made with refined flour. White rice. Meats and other proteins Fatty cuts of meat. Fried chicken or fried fish. Dairy Milk. Yogurt. Cream cheese. Sour cream. Fats and oils Butters. Beverages Soft  drinks. Other foods Cakes and pastries. The items listed above may not be a complete list of foods and beverages to avoid. Talk with your dietitian about what choices are best for you. Summary  Fiber is a type of carbohydrate. It is found in foods such as fruits, vegetables, whole grains, and beans.  A high-fiber diet has many benefits. It can help to prevent constipation, lower blood cholesterol, aid weight loss, and reduce your risk of heart disease, diabetes, and certain cancers.  Increase your intake of fiber gradually. Increasing fiber too quickly may cause cramping, bloating, and gas. Drink plenty of water while you increase the amount of fiber you consume.  The best sources of fiber include whole fruits and vegetables, whole grains, nuts, seeds, and beans. This information is not intended to replace advice given to you by your health care provider. Make sure you discuss any questions you have with your health care provider. Document Revised: 03/03/2020 Document Reviewed: 03/03/2020 Elsevier Patient Education  2021 Elsevier Inc.   Health Maintenance, Male Adopting a healthy lifestyle and getting preventive care are important in promoting health and wellness. Ask your health care provider about:  The right schedule for you to have regular tests and exams.  Things you can do on your own to prevent diseases and keep yourself healthy. What should I know about diet, weight, and exercise? Eat a healthy diet  Eat a diet that includes plenty of vegetables, fruits, low-fat dairy products, and lean protein.  Do not eat a lot of foods that are high in solid fats, added sugars, or sodium.   Maintain a healthy weight Body mass index (BMI) is a measurement that can be used to identify possible weight problems. It estimates body fat based on height and weight. Your health care provider can help determine your BMI and help you achieve or maintain a healthy weight. Get regular exercise Get  regular exercise. This is one of the most important things you can do for your health. Most adults should:  Exercise for at least 150 minutes each week. The exercise should increase your heart rate and make you sweat (moderate-intensity exercise).  Do strengthening exercises at least twice a week. This is in addition to the moderate-intensity exercise.  Spend less time sitting. Even light physical activity can be beneficial. Watch cholesterol and blood lipids Have your blood tested for lipids and cholesterol at 35 years of age, then have this test every 5 years. You may need to have your cholesterol levels checked more often if:  Your lipid or cholesterol levels are high.  You are older than 35 years of age.  You are at high risk for heart disease. What should I know about cancer screening? Many types of cancers can be detected early and may often be prevented. Depending on your health history and family history, you may need to have cancer screening at various ages. This may include screening for:  Colorectal cancer.  Prostate cancer.  Skin cancer.  Lung cancer. What should I know about heart disease, diabetes, and high blood pressure? Blood pressure and heart disease  High blood pressure causes heart disease and increases the risk of stroke. This is more likely to develop in people who have high blood pressure readings, are of African descent, or  are overweight.  Talk with your health care provider about your target blood pressure readings.  Have your blood pressure checked: ? Every 3-5 years if you are 58-33 years of age. ? Every year if you are 36 years old or older.  If you are between the ages of 30 and 58 and are a current or former smoker, ask your health care provider if you should have a one-time screening for abdominal aortic aneurysm (AAA). Diabetes Have regular diabetes screenings. This checks your fasting blood sugar level. Have the screening done:  Once every  three years after age 27 if you are at a normal weight and have a low risk for diabetes.  More often and at a younger age if you are overweight or have a high risk for diabetes. What should I know about preventing infection? Hepatitis B If you have a higher risk for hepatitis B, you should be screened for this virus. Talk with your health care provider to find out if you are at risk for hepatitis B infection. Hepatitis C Blood testing is recommended for:  Everyone born from [birth year] through 1965.  Anyone with known risk factors for hepatitis C. Sexually transmitted infections (STIs)  You should be screened each year for STIs, including gonorrhea and chlamydia, if: ? You are sexually active and are younger than 36 years of age. ? You are older than 35 years of age and your health care provider tells you that you are at risk for this type of infection. ? Your sexual activity has changed since you were last screened, and you are at increased risk for chlamydia or gonorrhea. Ask your health care provider if you are at risk.  Ask your health care provider about whether you are at high risk for HIV. Your health care provider may recommend a prescription medicine to help prevent HIV infection. If you choose to take medicine to prevent HIV, you should first get tested for HIV. You should then be tested every 3 months for as long as you are taking the medicine. Follow these instructions at home: Lifestyle  Do not use any products that contain nicotine or tobacco, such as cigarettes, e-cigarettes, and chewing tobacco. If you need help quitting, ask your health care provider.  Do not use street drugs.  Do not share needles.  Ask your health care provider for help if you need support or information about quitting drugs. Alcohol use  Do not drink alcohol if your health care provider tells you not to drink.  If you drink alcohol: ? Limit how much you have to 0-2 drinks a day. ? Be aware of how much  alcohol is in your drink. In the [place] one drink equals one 12 oz bottle of beer (355 mL), one 5 oz glass of wine (148 mL), or one 1 oz glass of hard liquor (44 mL). General instructions  Schedule regular health, dental, and eye exams.  Stay current with your vaccines.  Tell your health care provider if: ? You often feel depressed. ? You have ever been abused or do not feel safe at home. Summary  Adopting a healthy lifestyle and getting preventive care are important in promoting health and wellness.  Follow your health care provider's instructions about healthy diet, exercising, and getting tested or screened for diseases.  Follow your health care provider's instructions on monitoring your cholesterol and blood pressure. This information is not intended to replace advice given to you by your health care provider. Make sure you  discuss any questions you have with your health care provider. Document Revised: 10/22/2018 Document Reviewed: 10/22/2018 Elsevier Patient Education  2021 [employer].

## 2021-02-28 MED ORDER — ALBUTEROL SULFATE HFA 108 (90 BASE) MCG/ACT IN AERS: 2.0000 | INHALATION_SPRAY | RESPIRATORY_TRACT | 1 refills | Status: DC | PRN

## 2021-02-28 NOTE — Assessment & Plan Note (Signed)
Lives independently and has some assistance with financial and complex matters.

## 2021-02-28 NOTE — Assessment & Plan Note (Signed)
Flu shot counseled. Covid-19 3 shots currently. Tetanus declines. Counseled about sun safety and mole surveillance. Counseled about the dangers of distracted driving. Given 10 year screening recommendations.

## 2021-02-28 NOTE — Assessment & Plan Note (Signed)
No flare since last visit. Using breo and albuterol.

## 2021-07-16 ENCOUNTER — Other Ambulatory Visit: Payer: Self-pay | Admitting: Internal Medicine

## 2021-11-16 ENCOUNTER — Other Ambulatory Visit: Payer: Self-pay | Admitting: Internal Medicine

## 2022-02-26 ENCOUNTER — Telehealth: Payer: Self-pay | Admitting: Internal Medicine

## 2022-02-26 NOTE — Telephone Encounter (Signed)
Spoke with sister and she will call back to schedule AWV and CPE with PCP. ?

## 2022-03-01 ENCOUNTER — Other Ambulatory Visit: Payer: Self-pay | Admitting: Internal Medicine

## 2022-03-29 ENCOUNTER — Other Ambulatory Visit: Payer: Self-pay | Admitting: Internal Medicine

## 2022-05-20 ENCOUNTER — Other Ambulatory Visit: Payer: Self-pay | Admitting: Internal Medicine

## 2022-08-03 ENCOUNTER — Other Ambulatory Visit: Payer: Self-pay | Admitting: Internal Medicine

## 2022-08-05 ENCOUNTER — Other Ambulatory Visit: Payer: Self-pay | Admitting: Internal Medicine

## 2022-08-09 ENCOUNTER — Encounter: Payer: Self-pay | Admitting: Internal Medicine

## 2022-08-09 ENCOUNTER — Ambulatory Visit (INDEPENDENT_AMBULATORY_CARE_PROVIDER_SITE_OTHER): Payer: Medicare Other | Admitting: Internal Medicine

## 2022-08-09 VITALS — BP 128/84 | HR 88 | Temp 98.4°F | Ht 72.0 in | Wt 231.0 lb

## 2022-08-09 DIAGNOSIS — Z23 Encounter for immunization: Secondary | ICD-10-CM | POA: Diagnosis not present

## 2022-08-09 DIAGNOSIS — Z0000 Encounter for general adult medical examination without abnormal findings: Secondary | ICD-10-CM | POA: Diagnosis not present

## 2022-08-09 DIAGNOSIS — J4520 Mild intermittent asthma, uncomplicated: Secondary | ICD-10-CM | POA: Diagnosis not present

## 2022-08-09 DIAGNOSIS — Z13220 Encounter for screening for lipoid disorders: Secondary | ICD-10-CM

## 2022-08-09 DIAGNOSIS — F819 Developmental disorder of scholastic skills, unspecified: Secondary | ICD-10-CM | POA: Diagnosis not present

## 2022-08-09 LAB — COMPREHENSIVE METABOLIC PANEL
ALT: 16 U/L (ref 0–53)
AST: 16 U/L (ref 0–37)
Albumin: 4.3 g/dL (ref 3.5–5.2)
Alkaline Phosphatase: 62 U/L (ref 39–117)
BUN: 7 mg/dL (ref 6–23)
CO2: 30 mEq/L (ref 19–32)
Calcium: 9.3 mg/dL (ref 8.4–10.5)
Chloride: 101 mEq/L (ref 96–112)
Creatinine, Ser: 1.19 mg/dL (ref 0.40–1.50)
GFR: 78.94 mL/min (ref >60.00)
Glucose, Bld: 113 mg/dL — ABNORMAL HIGH (ref 70–99)
Potassium: 3.5 mEq/L (ref 3.5–5.1)
Sodium: 138 mEq/L (ref 135–145)
Total Bilirubin: 0.5 mg/dL (ref 0.2–1.2)
Total Protein: 7.2 g/dL (ref 6.0–8.3)

## 2022-08-09 LAB — CBC
HCT: 45.8 % (ref 39.0–52.0)
Hemoglobin: 15.8 g/dL (ref 13.0–17.0)
MCHC: 34.5 g/dL (ref 30.0–36.0)
MCV: 82.5 fl (ref 78.0–100.0)
Platelets: 253 10*3/uL (ref 150.0–400.0)
RBC: 5.55 Mil/uL (ref 4.22–5.81)
RDW: 13.4 % (ref 11.5–15.5)
WBC: 6 10*3/uL (ref 4.0–10.5)

## 2022-08-09 LAB — LIPID PANEL
Cholesterol: 166 mg/dL (ref 0–200)
HDL: 33.9 mg/dL — ABNORMAL LOW (ref >39.00)
NonHDL: 131.72
Total CHOL/HDL Ratio: 5
Triglycerides: 260 mg/dL — ABNORMAL HIGH (ref 0.0–149.0)
VLDL: 52 mg/dL — ABNORMAL HIGH (ref 0.0–40.0)

## 2022-08-09 LAB — LDL CHOLESTEROL, DIRECT: Direct LDL: 111 mg/dL

## 2022-08-09 MED ORDER — FLUTICASONE FUROATE-VILANTEROL 100-25 MCG/ACT IN AEPB: INHALATION_SPRAY | RESPIRATORY_TRACT | 3 refills | Status: DC

## 2022-08-09 MED ORDER — ALBUTEROL SULFATE HFA 108 (90 BASE) MCG/ACT IN AERS: 2.0000 | INHALATION_SPRAY | RESPIRATORY_TRACT | 6 refills | Status: DC | PRN

## 2022-08-09 NOTE — Progress Notes (Signed)
Subjective:   Patient ID: Jose Sampson, male    DOB: 01-12-1986, 36 y.o.   MRN: [id number]  HPI Here for medicare wellness and physical, no new complaints. Please see A/P for status and treatment of chronic medical problems.   Diet: heart healthy Physical activity: sedentary Depression/mood screen: negative Hearing: intact to whispered voice Visual acuity: grossly normal, performs annual eye exam  ADLs: capable Fall risk: none Home safety: good Cognitive evaluation: intact to orientation, naming, recall and repetition EOL planning: adv directives discussed  Flowsheet Row Office Visit from 08/09/2022 in [facility] at [employer]  PHQ-2 Total Score 0       Flowsheet Row Office Visit from 08/09/2022 in [facility] at [facility]  PHQ-9 Total Score 0         02/10/2014   11:57 AM 08/09/2022   11:25 AM  Fall Risk  Falls in the past year? No 0  Was there an injury with Fall?  0  Fall Risk Category Calculator  0  Fall Risk Category  Low    I have personally reviewed and have noted 1. The patient's medical and social history - reviewed today no changes 2. Their use of alcohol, tobacco or illicit drugs 3. Their current medications and supplements 4. The patient's functional ability including ADL's, fall risks, home safety risks and hearing or visual impairment. 5. Diet and physical activities 6. Evidence for depression or mood disorders 7. Care team reviewed and updated 8.  The patient is not on an opioid pain medication.  Patient Care Team: Myrlene Broker, MD as PCP - General (Internal Medicine) Nyoka Cowden, MD (Pulmonary Disease) Past Medical History:  Diagnosis Date   Asthma    Learning disability    Speech impediment    Past Surgical History:  Procedure Laterality Date   NO PAST SURGERIES     Family History  Problem Relation Age of Onset   Arthritis Mother    Arthritis Father    Hypertension Other    Diabetes Other    Asthma  Brother    Ovarian cancer Maternal Grandmother    Breast cancer Paternal Aunt    Review of Systems  Constitutional: Negative.   HENT: Negative.    Eyes: Negative.   Respiratory:  Negative for cough, chest tightness and shortness of breath.   Cardiovascular:  Negative for chest pain, palpitations and leg swelling.  Gastrointestinal:  Negative for abdominal distention, abdominal pain, constipation, diarrhea, nausea and vomiting.  Musculoskeletal: Negative.   Skin: Negative.   Neurological: Negative.   Psychiatric/Behavioral: Negative.      Objective:  Physical Exam Constitutional:      Appearance: He is well-developed.  HENT:     Head: Normocephalic and atraumatic.  Cardiovascular:     Rate and Rhythm: Normal rate and regular rhythm.  Pulmonary:     Effort: Pulmonary effort is normal. No respiratory distress.     Breath sounds: Normal breath sounds. No wheezing or rales.  Abdominal:     General: Bowel sounds are normal. There is no distension.     Palpations: Abdomen is soft.     Tenderness: There is no abdominal tenderness. There is no rebound.  Musculoskeletal:     Cervical back: Normal range of motion.  Skin:    General: Skin is warm and dry.  Neurological:     Mental Status: He is alert and oriented to person, place, and time.     Coordination: Coordination normal.  Vitals:   08/09/22 1123  BP: 128/84  Pulse: 88  Temp: 98.4 F (36.9 C)  SpO2: 97%  Weight: 231 lb (104.8 kg)  Height: 6' (1.829 m)    Assessment & Plan:  Flu shot given at visit

## 2022-08-10 NOTE — Assessment & Plan Note (Signed)
No flare today. Refill breo and albuterol and continue for now.

## 2022-08-10 NOTE — Assessment & Plan Note (Signed)
Flu shot given. Covid-19 counseled. Tetanus up to date. Counseled about sun safety and mole surveillance. Counseled about the dangers of distracted driving. Given 10 year screening recommendations.   

## 2022-08-10 NOTE — Assessment & Plan Note (Signed)
Not currently in school or work and sister aids him in living independently.

## 2023-07-18 ENCOUNTER — Other Ambulatory Visit: Payer: Self-pay | Admitting: Internal Medicine

## 2023-08-12 ENCOUNTER — Ambulatory Visit (INDEPENDENT_AMBULATORY_CARE_PROVIDER_SITE_OTHER): Payer: 59

## 2023-08-12 VITALS — Ht 72.0 in | Wt 231.0 lb

## 2023-08-12 DIAGNOSIS — Z0000 Encounter for general adult medical examination without abnormal findings: Secondary | ICD-10-CM

## 2023-08-12 NOTE — Patient Instructions (Signed)
Jose Sampson , Thank you for taking time to come for your Medicare Wellness Visit. I appreciate your ongoing commitment to your health goals. Please review the following plan we discussed and let me know if I can assist you in the future.   Referrals/Orders/Follow-Ups/Clinician Recommendations: You are due for a Flu vaccine and a Covid vaccine.  Aim for 30 minutes of exercise or brisk walking, 6-8 glasses of water, and 5 servings of fruits and vegetables each day. It was nice to speak with you today.  This is a list of the screening recommended for you and due dates:  Health Maintenance  Topic Date Due   HIV Screening  Never done   Hepatitis C Screening  Never done   Flu Shot  06/13/2023   COVID-19 Vaccine (1 - 2023-24 season) Never done   DTaP/Tdap/Td vaccine (2 - Tdap) 02/11/2024   Medicare Annual Wellness Visit  08/11/2024   HPV Vaccine  Aged Out    Advanced directives: (Copy Requested) Please bring a copy of your health care power of attorney and living will to the office to be added to your chart at your convenience.  Next Medicare Annual Wellness Visit scheduled for next year: Yes

## 2023-08-12 NOTE — Progress Notes (Signed)
Subjective:   Jose Sampson is a 37 y.o. male who presents for Medicare Annual/Subsequent preventive examination.  Visit Complete: Virtual  I connected with  Jose Sampson on 08/12/23 by a audio enabled telemedicine application and verified that I am speaking with the correct person using two identifiers.  Patient Location: Home  Provider Location: Office/Clinic  I discussed the limitations of evaluation and management by telemedicine. The patient expressed understanding and agreed to proceed.  Because this visit was a virtual/telehealth visit, some criteria may be missing or patient reported. Any vitals not documented were not able to be obtained and vitals that have been documented are patient reported.   BMW4-1- Not done, due to patient disability.  Cardiac Risk Factors include: advanced age (>22men, >17 women);male gender     Objective:    Today's Vitals   08/12/23 1438  Weight: 231 lb (104.8 kg)  Height: 6' (1.829 m)   Body mass index is 31.33 kg/m.     08/12/2023    2:43 PM  Advanced Directives  Does Patient Have a Medical Advance Directive? No    Current Medications (verified) Outpatient Encounter Medications as of 08/12/2023  Medication Sig   albuterol (VENTOLIN HFA) 108 (90 Base) MCG/ACT inhaler INHALE 2 PUFFS INTO THE LUNGS EVERY 4 HOURS AS NEEDED FOR WHEEZING OR SHORTNESS OF BREATH   fluticasone furoate-vilanterol (BREO ELLIPTA) 100-25 MCG/ACT AEPB INHALE ONE PUFF INTO THE LUNGS DAILY. OFFICE VISIT IS DUE   No facility-administered encounter medications on file as of 08/12/2023.    Allergies (verified) Patient has no known allergies.   History: Past Medical History:  Diagnosis Date   Asthma    Learning disability    Speech impediment    Past Surgical History:  Procedure Laterality Date   NO PAST SURGERIES     Family History  Problem Relation Age of Onset   Arthritis Mother    Arthritis Father    Hypertension Other    Diabetes Other    Asthma  Brother    Ovarian cancer Maternal Grandmother    Breast cancer Paternal Aunt    Social History   Socioeconomic History   Marital status: Single    Spouse name: Not on file   Number of children: Not on file   Years of education: Not on file   Highest education level: Not on file  Occupational History   Occupation: Unemployed    Employer: [employer]  Tobacco Use   Smoking status: Never   Smokeless tobacco: Never   Tobacco comments:    Lives alone in apt, single-no partner and not sexually active. Want to go to school at [employer] for art  Vaping Use   Vaping status: Never Used  Substance and Sexual Activity   Alcohol use: No   Drug use: No   Sexual activity: Not on file  Other Topics Concern   Not on file  Social History Narrative   Lives alone.   Social Determinants of Health   Financial Resource Strain: Low Risk  (08/12/2023)   Overall Financial Resource Strain (CARDIA)    Difficulty of Paying Living Expenses: Not hard at all  Food Insecurity: No Food Insecurity (08/12/2023)   Hunger Vital Sign    Worried About Running Out of Food in the Last Year: Never true    Ran Out of Food in the Last Year: Never true  Transportation Needs: No Transportation Needs (08/12/2023)   PRAPARE - Administrator, Civil Service (Medical): No  Lack of Transportation (Non-Medical): No  Physical Activity: Insufficiently Active (08/12/2023)   Exercise Vital Sign    Days of Exercise per Week: 1 day    Minutes of Exercise per Session: 30 min  Stress: No Stress Concern Present (08/12/2023)   [employer] of Occupational Health - Occupational Stress Questionnaire    Feeling of Stress : Not at all  Social Connections: Socially Isolated (08/12/2023)   Social Connection and Isolation Panel [NHANES]    Frequency of Communication with Friends and Family: Twice a week    Frequency of Social Gatherings with Friends and Family: Once a week    Attends Religious Services: Never     Database administrator or Organizations: No    Attends Banker Meetings: Never    Marital Status: Never married    Tobacco Counseling Counseling given: Not Answered Tobacco comments: Lives alone in apt, single-no partner and not sexually active. Want to go to school at [employer] for art   Clinical Intake:  Pre-visit preparation completed: Yes  Pain : No/denies pain     BMI - recorded: 31.33 Nutritional Status: BMI > 30  Obese Nutritional Risks: None Diabetes: No  How often do you need to have someone help you when you read instructions, pamphlets, or other written materials from your doctor or pharmacy?: 1 - Never  Interpreter Needed?: No  Information entered by :: Jose Sampson, RMA   Activities of Daily Living    08/12/2023    2:40 PM  In your present state of health, do you have any difficulty performing the following activities:  Hearing? 0  Vision? 0  Difficulty concentrating or making decisions? 0  Walking or climbing stairs? 0  Dressing or bathing? 0  Doing errands, shopping? 0  Preparing Food and eating ? N  Using the Toilet? N  In the past six months, have you accidently leaked urine? N  Do you have problems with loss of bowel control? N  Managing your Medications? N  Managing your Finances? N  Housekeeping or managing your Housekeeping? N    Patient Care Team: Jose Broker, MD as PCP - General (Internal Medicine) Nyoka Cowden, MD (Pulmonary Disease)  Indicate any recent Medical Services you may have received from other than [facility] providers in the past year (date may be approximate).     Assessment:   This is a routine wellness examination for [name].  Hearing/Vision screen Hearing Screening - Comments:: Denies hearing difficulties   Vision Screening - Comments:: Denies vision issues.   Goals Addressed   None   Depression Screen    08/12/2023    2:54 PM 08/09/2022   11:24 AM 02/27/2021    3:59 PM 11/03/2019    1:32 PM  02/10/2014   11:57 AM 01/14/2013    2:31 PM  PHQ 2/9 Scores  PHQ - 2 Score  0 0 0 0 0  PHQ- 9 Score  0      Exception Documentation Patient refusal       Not completed Patient did not answer questions.         Fall Risk    08/12/2023    2:43 PM 08/09/2022   11:25 AM 02/10/2014   11:57 AM  Fall Risk   Falls in the past year? 0 0 No  Number falls in past yr: 0 0   Injury with Fall? 0 0   Risk for fall due to : No Fall Risks    Follow up Falls  prevention discussed;Falls evaluation completed      MEDICARE RISK AT HOME: Medicare Risk at Home Any stairs in or around the home?: Yes If so, are there any without handrails?: Yes Home free of loose throw rugs in walkways, pet beds, electrical cords, etc?: Yes Adequate lighting in your home to reduce risk of falls?: Yes Life alert?: No Use of a cane, walker or w/c?: No Grab bars in the bathroom?: No Shower chair or bench in shower?: No Elevated toilet seat or a handicapped toilet?: No  TIMED UP AND GO:  Was the test performed?  No    Cognitive Function:        08/12/2023    2:47 PM  6CIT Screen  What Year? 0 points  What month? 0 points  What time? 0 points  Count back from 20 0 points  Months in reverse 0 points  Repeat phrase 2 points  Total Score 2 points    Immunizations Immunization History  Administered Date(s) Administered   Influenza, Seasonal, Injecte, Preservative Fre 01/14/2013   Influenza,inj,Quad PF,6+ Mos 07/22/2013, 10/17/2018, 11/03/2019, 08/09/2022   Pneumococcal Polysaccharide-23 02/10/2014   Td 02/10/2014    TDAP status: Up to date  Flu Vaccine status: Due, Education has been provided regarding the importance of this vaccine. Advised may receive this vaccine at local pharmacy or Health Dept. Aware to provide a copy of the vaccination record if obtained from local pharmacy or Health Dept. Verbalized acceptance and understanding.   Covid-19 vaccine status: Completed vaccines  Qualifies for Shingles  Vaccine? No   Zostavax completed No   Shingrix Completed?: No.    Education has been provided regarding the importance of this vaccine. Patient has been advised to call insurance company to determine out of pocket expense if they have not yet received this vaccine. Advised may also receive vaccine at local pharmacy or Health Dept. Verbalized acceptance and understanding.  Screening Tests Health Maintenance  Topic Date Due   HIV Screening  Never done   Hepatitis C Screening  Never done   INFLUENZA VACCINE  06/13/2023   COVID-19 Vaccine (1 - 2023-24 season) Never done   DTaP/Tdap/Td (2 - Tdap) 02/11/2024   Medicare Annual Wellness (AWV)  08/11/2024   HPV VACCINES  Aged Out    Health Maintenance  Health Maintenance Due  Topic Date Due   HIV Screening  Never done   Hepatitis C Screening  Never done   INFLUENZA VACCINE  06/13/2023   COVID-19 Vaccine (1 - 2023-24 season) Never done    Lung Cancer Screening: (Low Dose CT Chest recommended if Age 69-80 years, 20 pack-year currently smoking OR have quit w/in 15years.) does not qualify.   Lung Cancer Screening Referral: N/A  Additional Screening:  Hepatitis C Screening: does qualify;   Vision Screening: Recommended annual ophthalmology exams for early detection of glaucoma and other disorders of the eye. Is the patient up to date with their annual eye exam?  No  Who is the provider or what is the name of the office in which the patient attends annual eye exams? N/A If pt is not established with a provider, would they like to be referred to a provider to establish care? Yes .   Dental Screening: Recommended annual dental exams for proper oral hygiene   Community Resource Referral / Chronic Care Management: CRR required this visit?  No   CCM required this visit?  No     Plan:     I have personally reviewed and  noted the following in the patient's chart:   Medical and social history Use of alcohol, tobacco or illicit drugs   Current medications and supplements including opioid prescriptions. Patient is not currently taking opioid prescriptions. Functional ability and status Nutritional status Physical activity Advanced directives List of other physicians Hospitalizations, surgeries, and ER visits in previous 12 months Vitals Screenings to include cognitive, depression, and falls Referrals and appointments  In addition, I have reviewed and discussed with patient certain preventive protocols, quality metrics, and best practice recommendations. A written personalized care plan for preventive services as well as general preventive health recommendations were provided to patient.     Leasha Goldberger L Fredy Gladu, CMA   08/12/2023   After Visit Summary: (Pick Up) Due to this being a telephonic visit, with patients personalized plan was offered to patient and patient has requested to Pick up at office.  Nurse Notes: Patient is due for a Flu and Covid vaccine, however patient stated that he recently got both vaccines.  I did not find that he did in the [facility].  Patient is also due for some blood work, HIV screening and Hep C screening.  He had no other concerns to address today.

## 2023-08-22 ENCOUNTER — Encounter: Payer: 59 | Admitting: Internal Medicine

## 2023-08-26 ENCOUNTER — Other Ambulatory Visit: Payer: Self-pay | Admitting: Internal Medicine

## 2023-08-30 ENCOUNTER — Encounter: Payer: Self-pay | Admitting: Internal Medicine

## 2023-08-30 ENCOUNTER — Ambulatory Visit (INDEPENDENT_AMBULATORY_CARE_PROVIDER_SITE_OTHER): Payer: 59 | Admitting: Internal Medicine

## 2023-08-30 VITALS — BP 140/80 | HR 96 | Temp 98.7°F | Ht 72.0 in | Wt 222.0 lb

## 2023-08-30 DIAGNOSIS — Z23 Encounter for immunization: Secondary | ICD-10-CM

## 2023-08-30 DIAGNOSIS — J4520 Mild intermittent asthma, uncomplicated: Secondary | ICD-10-CM

## 2023-08-30 DIAGNOSIS — F819 Developmental disorder of scholastic skills, unspecified: Secondary | ICD-10-CM

## 2023-08-30 DIAGNOSIS — Z0000 Encounter for general adult medical examination without abnormal findings: Secondary | ICD-10-CM | POA: Diagnosis not present

## 2023-08-30 MED ORDER — ALBUTEROL SULFATE HFA 108 (90 BASE) MCG/ACT IN AERS: 2.0000 | INHALATION_SPRAY | RESPIRATORY_TRACT | 0 refills | Status: DC | PRN

## 2023-08-30 NOTE — Assessment & Plan Note (Signed)
Flu shot given. Tetanus up to date. Counseled about sun safety and mole surveillance. Counseled about the dangers of distracted driving. Given 10 year screening recommendations.

## 2023-08-30 NOTE — Progress Notes (Signed)
   Subjective:   Patient ID: Jose Sampson, male    DOB: 06/13/1986, 37 y.o.   MRN: [id number]  HPI The patient is here for physical.  PMH, [facility], social history reviewed and updated  Review of Systems  Constitutional: Negative.   HENT: Negative.    Eyes: Negative.   Respiratory:  Negative for cough, chest tightness and shortness of breath.   Cardiovascular:  Negative for chest pain, palpitations and leg swelling.  Gastrointestinal:  Negative for abdominal distention, abdominal pain, constipation, diarrhea, nausea and vomiting.  Musculoskeletal: Negative.   Skin: Negative.   Neurological: Negative.   Psychiatric/Behavioral: Negative.      Objective:  Physical Exam Constitutional:      Appearance: He is well-developed.  HENT:     Head: Normocephalic and atraumatic.  Cardiovascular:     Rate and Rhythm: Normal rate and regular rhythm.  Pulmonary:     Effort: Pulmonary effort is normal. No respiratory distress.     Breath sounds: Normal breath sounds. No wheezing or rales.  Abdominal:     General: Bowel sounds are normal. There is no distension.     Palpations: Abdomen is soft.     Tenderness: There is no abdominal tenderness. There is no rebound.  Musculoskeletal:     Cervical back: Normal range of motion.  Skin:    General: Skin is warm and dry.  Neurological:     Mental Status: He is alert and oriented to person, place, and time.     Coordination: Coordination normal.     Vitals:   08/30/23 1107 08/30/23 1112  BP: (!) 140/80 (!) 140/80  Pulse: 96   Temp: 98.7 F (37.1 C)   TempSrc: Oral   SpO2: 98%   Weight: 222 lb (100.7 kg)   Height: 6' (1.829 m)     Assessment & Plan:  Flu shot given at visit

## 2023-08-30 NOTE — Assessment & Plan Note (Signed)
Stable and sister aids with living mostly independently.

## 2023-08-30 NOTE — Assessment & Plan Note (Signed)
Uses rare albuterol and refilled so he will have available if needed.

## 2023-09-11 ENCOUNTER — Other Ambulatory Visit: Payer: Self-pay | Admitting: Internal Medicine

## 2024-03-23 ENCOUNTER — Other Ambulatory Visit: Payer: Self-pay | Admitting: Internal Medicine

## 2024-07-01 ENCOUNTER — Other Ambulatory Visit: Payer: Self-pay | Admitting: Internal Medicine

## 2024-07-17 ENCOUNTER — Ambulatory Visit

## 2024-08-26 ENCOUNTER — Other Ambulatory Visit: Payer: Self-pay | Admitting: Internal Medicine

## 2024-08-31 ENCOUNTER — Ambulatory Visit: Admitting: Internal Medicine

## 2024-08-31 ENCOUNTER — Encounter: Payer: Self-pay | Admitting: Internal Medicine

## 2024-08-31 VITALS — BP 124/70 | HR 89 | Temp 97.9°F | Ht 72.0 in | Wt 223.6 lb

## 2024-08-31 DIAGNOSIS — Z23 Encounter for immunization: Secondary | ICD-10-CM | POA: Diagnosis not present

## 2024-08-31 DIAGNOSIS — Z0000 Encounter for general adult medical examination without abnormal findings: Secondary | ICD-10-CM | POA: Diagnosis not present

## 2024-08-31 DIAGNOSIS — F819 Developmental disorder of scholastic skills, unspecified: Secondary | ICD-10-CM | POA: Diagnosis not present

## 2024-08-31 DIAGNOSIS — J4520 Mild intermittent asthma, uncomplicated: Secondary | ICD-10-CM | POA: Diagnosis not present

## 2024-08-31 NOTE — Progress Notes (Signed)
   Subjective:   Patient ID: Kelsen Celona, male    DOB: [birth year], 38 y.o.   MRN: [id number]  The patient is here for physical. Pertinent topics discussed: Discussed the use of AI scribe software for clinical note transcription with the patient, who gave verbal consent to proceed.  History of Present Illness Aldon Hengst is a 38 year old male with asthma who presents for a routine follow-up and flu vaccination.  His asthma is generally well-controlled with the use of a daily inhaler and an albuterol  inhaler for emergencies, which he finds effective. No new chest pain, tightness, or pressure has been experienced, and he has not required hospital visits for breathing issues in the past year. Cold weather and fall allergies are mild triggers for his asthma symptoms, but he feels he has been doing well overall.  He is actively trying to lose weight and engages in regular exercise. He experiences some gastrointestinal symptoms, such as gas and bloating, which he manages by drinking bottled water due to concerns about tap water contamination.  PMH, [facility], social history reviewed and updated  Review of Systems  Constitutional: Negative.   HENT: Negative.    Eyes: Negative.   Respiratory:  Negative for cough, chest tightness and shortness of breath.   Cardiovascular:  Negative for chest pain, palpitations and leg swelling.  Gastrointestinal:  Negative for abdominal distention, abdominal pain, constipation, diarrhea, nausea and vomiting.  Musculoskeletal:  Positive for myalgias.  Skin: Negative.   Neurological: Negative.   Psychiatric/Behavioral: Negative.      Objective:  Physical Exam Constitutional:      Appearance: He is well-developed.  HENT:     Head: Normocephalic and atraumatic.  Cardiovascular:     Rate and Rhythm: Normal rate and regular rhythm.  Pulmonary:     Effort: Pulmonary effort is normal. No respiratory distress.     Breath sounds: Normal breath sounds. No wheezing or  rales.  Abdominal:     General: Bowel sounds are normal. There is no distension.     Palpations: Abdomen is soft.     Tenderness: There is no abdominal tenderness.  Musculoskeletal:     Cervical back: Normal range of motion.  Skin:    General: Skin is warm and dry.  Neurological:     Mental Status: He is alert and oriented to person, place, and time.     Coordination: Coordination normal.     Vitals:   08/31/24 1512  BP: 124/70  Pulse: 89  Temp: 97.9 F (36.6 C)  TempSrc: Oral  SpO2: 95%  Weight: 223 lb 9.6 oz (101.4 kg)  Height: 6' (1.829 m)    Assessment & Plan:  Flu and prevnar 20 given at visit

## 2024-09-01 NOTE — Addendum Note (Signed)
 Addended by: ROLLENE NORRIS A on: 09/01/2024 01:15 PM   Modules accepted: Level of Service

## 2024-09-01 NOTE — Assessment & Plan Note (Signed)
 Continue breo 100/25 daily and albuterol  as needed. No flare today.

## 2024-09-01 NOTE — Assessment & Plan Note (Signed)
 Lives mostly independently sister helps when needed.

## 2024-09-01 NOTE — Assessment & Plan Note (Signed)
 Flu shot given. Pneumonia given 20. Tetanus due at pharmacy. Counseled about sun safety and mole surveillance. Counseled about the dangers of distracted driving. Given 10 year screening recommendations.

## 2024-09-07 ENCOUNTER — Other Ambulatory Visit: Payer: Self-pay | Admitting: Internal Medicine

## 2024-10-05 ENCOUNTER — Other Ambulatory Visit: Payer: Self-pay | Admitting: Internal Medicine

## 2024-10-05 ENCOUNTER — Ambulatory Visit

## 2024-10-05 NOTE — Telephone Encounter (Unsigned)
 Copied from CRM #[id number]. Topic: Clinical - Medication Refill >> Oct 05, 2024  2:32 PM Drema MATSU wrote: Medication: fluticasone  furoate-vilanterol (BREO ELLIPTA ) 100-25 MCG/ACT AEPB  Has the patient contacted their pharmacy? Yes (Agent: If no, request that the patient contact the pharmacy for the refill. If patient does not wish to contact the pharmacy document the reason why and proceed with request.) no response from provider (Agent: If yes, when and what did the pharmacy advise?)  This is the patient's preferred pharmacy:  [id number] - [place], [place] - [address] AT [address] & [address] [place] [address] Phone: [phone] Fax: [phone]  Is this the correct pharmacy for this prescription? Yes If no, delete pharmacy and type the correct one.   Has the prescription been filled recently? Yes  Is the patient out of the medication? No  Has the patient been seen for an appointment in the last year OR does the patient have an upcoming appointment? Yes  Can we respond through MyChart? No  Agent: Please be advised that Rx refills may take up to 3 business days. We ask that you follow-up with your pharmacy.

## 2024-10-07 ENCOUNTER — Ambulatory Visit

## 2024-10-07 VITALS — Ht 69.0 in | Wt 220.0 lb

## 2024-10-07 DIAGNOSIS — Z0000 Encounter for general adult medical examination without abnormal findings: Secondary | ICD-10-CM | POA: Diagnosis not present

## 2024-10-07 DIAGNOSIS — Z114 Encounter for screening for human immunodeficiency virus [HIV]: Secondary | ICD-10-CM

## 2024-10-07 DIAGNOSIS — Z1159 Encounter for screening for other viral diseases: Secondary | ICD-10-CM

## 2024-10-07 MED ORDER — FLUTICASONE FUROATE-VILANTEROL 100-25 MCG/ACT IN AEPB: 1.0000 | INHALATION_SPRAY | Freq: Every day | RESPIRATORY_TRACT | 0 refills | Status: DC

## 2024-10-07 NOTE — Progress Notes (Signed)
 Chief Complaint  Patient presents with   Medicare Wellness     Subjective:  Please attest and cosign this visit due to patients primary care provider not being in the office at the time the visit was completed.  (Pt of Dr Almarie Cleveland)   Jose Sampson is a 38 y.o. male who presents for a Medicare Annual Wellness Visit.  I connected with  Jose Sampson on 10/07/24 by a audio enabled telemedicine application and verified that I am speaking with the correct person using two identifiers.  Patient Location: Home  Provider Location: Office/Clinic  Persons Participating in Visit: Sister, Gregrey Bloyd and patient was present during visit.  I discussed the limitations of evaluation and management by telemedicine. The patient expressed understanding and agreed to proceed.  Vital Signs: Because this visit was a virtual/telehealth visit, some criteria may be missing or patient reported. Any vitals not documented were not able to be obtained and vitals that have been documented are patient reported.   Allergies (verified) Patient has no known allergies.   History: Past Medical History:  Diagnosis Date   Asthma    Learning disability    Speech impediment    Past Surgical History:  Procedure Laterality Date   NO PAST SURGERIES     Family History  Problem Relation Age of Onset   Arthritis Mother    Arthritis Father    Hypertension Other    Diabetes Other    Asthma Brother    Ovarian cancer Maternal Grandmother    Breast cancer Paternal Aunt    Social History   Occupational History   Occupation: Unemployed    Employer: [employer]  Tobacco Use   Smoking status: Never   Smokeless tobacco: Never   Tobacco comments:    Lives alone in apt, single-no partner and not sexually active. Want to go to school at [employer] for art  Vaping Use   Vaping status: Never Used  Substance and Sexual Activity   Alcohol use: No   Drug use: No   Sexual activity: Not on file   Tobacco  Counseling Counseling given: Not Answered Tobacco comments: Lives alone in apt, single-no partner and not sexually active. Want to go to school at [employer] for art  SDOH Screenings   Food Insecurity: No Food Insecurity (10/07/2024)  Housing: Unknown (10/07/2024)  Transportation Needs: No Transportation Needs (10/07/2024)  Utilities: Not At Risk (10/07/2024)  Alcohol Screen: Low Risk  (08/12/2023)  Depression (PHQ2-9): Low Risk  (10/07/2024)  Financial Resource Strain: Low Risk  (08/12/2023)  Physical Activity: Inactive (10/07/2024)  Social Connections: Socially Isolated (10/07/2024)  Stress: No Stress Concern Present (10/07/2024)  Tobacco Use: Low Risk  (10/07/2024)  Health Literacy: Inadequate Health Literacy (10/07/2024)   See flowsheets for full screening details  Depression Screen PHQ 2 & 9 Depression Scale- Over the past 2 weeks, how often have you been bothered by any of the following problems? Little interest or pleasure in doing things: 0 Feeling down, depressed, or hopeless (PHQ Adolescent also includes...irritable): 0 PHQ-2 Total Score: 0 Trouble falling or staying asleep, or sleeping too much: 0 Feeling tired or having little energy: 0 Poor appetite or overeating (PHQ Adolescent also includes...weight loss): 0 Feeling bad about yourself - or that you are a failure or have let yourself or your family down: 0 Trouble concentrating on things, such as reading the newspaper or watching television (PHQ Adolescent also includes...like school work): 0 Moving or speaking so slowly that other people could have noticed.  Or the opposite - being so fidgety or restless that you have been moving around a lot more than usual: 0 Thoughts that you would be better off dead, or of hurting yourself in some way: 0 PHQ-9 Total Score: 0 If you checked off any problems, how difficult have these problems made it for you to do your work, take care of things at home, or get along with other people?: Not  difficult at all  Depression Treatment Depression Interventions/Treatment : EYV7-0 Score <4 Follow-up Not Indicated     Goals Addressed               This Visit's Progress     Patient Stated (pt-stated)        Patient and Sister stated that the goal is to continue maintaining and managing health       Visit info / Clinical Intake: Medicare Wellness Visit Type:: Subsequent Annual Wellness Visit Persons participating in visit:: caregiver (with patient present) Medicare Wellness Visit Mode:: Telephone If telephone:: video declined Because this visit was a virtual/telehealth visit:: vitals recorded from last visit If Telephone or Video please confirm:: I connected with the patient using audio enabled telemedicine application and verified that I am speaking with the correct person using two identifiers; I discussed the limitations of evaluation and management by telemedicine; The patient expressed understanding and agreed to proceed Patient Location:: Home Provider Location:: Office Information given by:: patient Interpreter Needed?: No Pre-visit prep was completed: yes AWV questionnaire completed by patient prior to visit?: no Living arrangements:: (!) lives alone Patient's Overall Health Status Rating: good Typical amount of pain: none Does pain affect daily life?: no Are you currently prescribed opioids?: no  Dietary Habits and Nutritional Risks How many meals a day?: 3 Eats fruit and vegetables daily?: yes Most meals are obtained by: having others provide food In the last 2 weeks, have you had any of the following?: none Diabetic:: no  Functional Status Activities of Daily Living (to include ambulation/medication): Independent Ambulation: Independent Medication Administration: Needs assistance (comment) (Sister helps) Is this a change from baseline?: Change from baseline, expected to last >3 days Home Management: Independent Manage your own finances?: yes Primary  transportation is: family/friends (Sister helps) Concerns about hearing?: no  Fall Screening Falls in the past year?: 0 Number of falls in past year: 0 Was there an injury with Fall?: 0 Fall Risk Category Calculator: 0 Patient Fall Risk Level: Low Fall Risk  Fall Risk Patient at Risk for Falls Due to: No Fall Risks Fall risk Follow up: Falls evaluation completed; Falls prevention discussed  Home and Transportation Safety: All rugs have non-skid backing?: N/A, no rugs All stairs or steps have railings?: N/A, no stairs Grab bars in the bathtub or shower?: (!) no Have non-skid surface in bathtub or shower?: yes Good home lighting?: yes Regular seat belt use?: yes Hospital stays in the last year:: no  Cognitive Assessment Difficulty concentrating, remembering, or making decisions? : no Will 6CIT or Mini Cog be Completed: no 6CIT or Mini Cog Declined: patient declined (pt is able alert and able to recall some events)  Advance Directives (For Healthcare) Does Patient Have a Medical Advance Directive?: No Would patient like information on creating a medical advance directive?: No - Patient declined  Reviewed/Updated  Reviewed/Updated: Reviewed All (Medical, Surgical, Family, Medications, Allergies, Care Teams, Patient Goals)        Objective:    Today's Vitals   10/07/24 1239  Weight: 220 lb (99.8 kg)  Height: 5' 9 (1.753 m)   Body mass index is 32.49 kg/m.  Current Medications (verified) Outpatient Encounter Medications as of 10/07/2024  Medication Sig   albuterol  (VENTOLIN  HFA) 108 (90 Base) MCG/ACT inhaler Inhale 2 puffs into the lungs every 4 (four) hours as needed for wheezing or shortness of breath.   fluticasone  furoate-vilanterol (BREO ELLIPTA ) 100-25 MCG/ACT AEPB INHALE 1 PUFF BY MOUTH DAILY   No facility-administered encounter medications on file as of 10/07/2024.   Hearing/Vision screen Hearing Screening - Comments:: Denies hearing difficulties   Vision  Screening - Comments:: Denies vision concerns Immunizations and Health Maintenance Health Maintenance  Topic Date Due   HIV Screening  Never done   Hepatitis C Screening  Never done   Hepatitis B Vaccines 19-59 Average Risk (1 of 3 - 19+ 3-dose series) Never done   HPV VACCINES (1 - 3-dose SCDM series) Never done   DTaP/Tdap/Td (2 - Tdap) 02/11/2024   COVID-19 Vaccine (1 - 2025-26 season) Never done   Medicare Annual Wellness (AWV)  10/07/2025   Pneumococcal Vaccine  Completed   Influenza Vaccine  Completed   Meningococcal B Vaccine  Aged Out        Assessment/Plan:  This is a routine wellness examination for [place].  Patient Care Team: Rollene Almarie LABOR, MD as PCP - General (Internal Medicine) Darlean Ozell NOVAK, MD (Pulmonary Disease)  I have personally reviewed and noted the following in the patient's chart:   Medical and social history Use of alcohol, tobacco or illicit drugs  Current medications and supplements including opioid prescriptions. Functional ability and status Nutritional status Physical activity Advanced directives List of other physicians Hospitalizations, surgeries, and ER visits in previous 12 months Vitals Screenings to include cognitive, depression, and falls Referrals and appointments  Orders Placed This Encounter  Procedures   HIV Antibody (routine testing w rflx)    Standing Status:   Future    Expiration Date:   10/07/2025   Hepatitis C antibody    Standing Status:   Future    Expiration Date:   10/07/2025   In addition, I have reviewed and discussed with patient certain preventive protocols, quality metrics, and best practice recommendations. A written personalized care plan for preventive services as well as general preventive health recommendations were provided to patient.   Verdie CHRISTELLA Saba, CMA   10/07/2024   Return in 1 year (on 10/07/2025).  After Visit Summary: (MyChart) Due to this being a telephonic visit, the after visit  summary with patients personalized plan was offered to patient via MyChart   Nurse Notes: Ordered HIV/Hepatitis C Screening labs; Scheduled 2026 AWV/CPE appts

## 2024-10-07 NOTE — Patient Instructions (Signed)
 Jose Sampson,  Thank you for taking the time for your Medicare Wellness Visit. I appreciate your continued commitment to your health goals. Please review the care plan we discussed, and feel free to reach out if I can assist you further.  Please note that Annual Wellness Visits do not include a physical exam. Some assessments may be limited, especially if the visit was conducted virtually. If needed, we may recommend an in-person follow-up with your provider.  Ongoing Care Seeing your primary care provider every 3 to 6 months helps us  monitor your health and provide consistent, personalized care.   Referrals If a referral was made during today's visit and you haven't received any updates within two weeks, please contact the referred provider directly to check on the status.  Recommended Screenings:  Health Maintenance  Topic Date Due   HIV Screening  Never done   Hepatitis C Screening  Never done   Hepatitis B Vaccine (1 of 3 - 19+ 3-dose series) Never done   HPV Vaccine (1 - 3-dose SCDM series) Never done   DTaP/Tdap/Td vaccine (2 - Tdap) 02/11/2024   COVID-19 Vaccine (1 - 2025-26 season) Never done   Medicare Annual Wellness Visit  10/07/2025   Pneumococcal Vaccine  Completed   Flu Shot  Completed   Meningitis B Vaccine  Aged Out       10/07/2024   12:40 PM  Advanced Directives  Does Patient Have a Medical Advance Directive? No  Would patient like information on creating a medical advance directive? No - Patient declined    Vision: Annual vision screenings are recommended for early detection of glaucoma, cataracts, and diabetic retinopathy. These exams can also reveal signs of chronic conditions such as diabetes and high blood pressure.  Dental: Annual dental screenings help detect early signs of oral cancer, gum disease, and other conditions linked to overall health, including heart disease and diabetes.

## 2024-10-25 ENCOUNTER — Other Ambulatory Visit: Payer: Self-pay | Admitting: Internal Medicine

## 2024-11-13 ENCOUNTER — Telehealth: Payer: Self-pay

## 2024-11-13 NOTE — Telephone Encounter (Signed)
 Copied from CRM [id number]. Topic: Clinical - Medical Advice >> Nov 13, 2024  9:55 AM Charolett L wrote: Reason for CRM: Patient sister crystal call in to verify his conditions to keep his benefits with his insurance for the new year. Patient sister is requesting a call [phone]

## 2024-11-16 NOTE — Telephone Encounter (Signed)
 We would follow patient's DPR if a family member is requesting call.

## 2024-11-17 ENCOUNTER — Other Ambulatory Visit: Payer: Self-pay | Admitting: Internal Medicine

## 2024-12-08 ENCOUNTER — Other Ambulatory Visit: Payer: Self-pay | Admitting: Internal Medicine

## 2025-10-18 ENCOUNTER — Encounter: Admitting: Internal Medicine

## 2025-10-18 ENCOUNTER — Ambulatory Visit
